# Patient Record
Sex: Female | Born: 1970 | Race: White | Hispanic: No | Marital: Married | Smoking: Never smoker
Health system: Southern US, Community
[De-identification: ages and names within clinical notes are randomized; demographics above are authoritative.]

## PROBLEM LIST (undated history)

## (undated) DIAGNOSIS — C50919 Malignant neoplasm of unspecified site of unspecified female breast: Secondary | ICD-10-CM

## (undated) DIAGNOSIS — T8859XA Other complications of anesthesia, initial encounter: Secondary | ICD-10-CM

## (undated) DIAGNOSIS — IMO0001 Reserved for inherently not codable concepts without codable children: Secondary | ICD-10-CM

## (undated) DIAGNOSIS — R112 Nausea with vomiting, unspecified: Secondary | ICD-10-CM

## (undated) DIAGNOSIS — T4145XA Adverse effect of unspecified anesthetic, initial encounter: Secondary | ICD-10-CM

## (undated) DIAGNOSIS — F419 Anxiety disorder, unspecified: Secondary | ICD-10-CM

## (undated) DIAGNOSIS — K219 Gastro-esophageal reflux disease without esophagitis: Secondary | ICD-10-CM

## (undated) DIAGNOSIS — Z9889 Other specified postprocedural states: Secondary | ICD-10-CM

## (undated) DIAGNOSIS — C801 Malignant (primary) neoplasm, unspecified: Secondary | ICD-10-CM

## (undated) HISTORY — PX: BREAST LUMPECTOMY: SHX2

## (undated) HISTORY — DX: Reserved for inherently not codable concepts without codable children: IMO0001

## (undated) HISTORY — PX: REDUCTION MAMMAPLASTY: SUR839

## (undated) HISTORY — DX: Anxiety disorder, unspecified: F41.9

## (undated) HISTORY — DX: Malignant neoplasm of unspecified site of unspecified female breast: C50.919

---
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
## 1998-12-13 ENCOUNTER — Other Ambulatory Visit: Admission: RE | Admit: 1998-12-13 | Discharge: 1998-12-13 | Payer: Self-pay | Admitting: Obstetrics and Gynecology

## 1999-11-28 ENCOUNTER — Other Ambulatory Visit: Admission: RE | Admit: 1999-11-28 | Discharge: 1999-11-28 | Payer: Self-pay | Admitting: Obstetrics and Gynecology

## 2000-01-07 ENCOUNTER — Encounter (INDEPENDENT_AMBULATORY_CARE_PROVIDER_SITE_OTHER): Payer: Self-pay | Admitting: Specialist

## 2000-01-07 ENCOUNTER — Other Ambulatory Visit: Admission: RE | Admit: 2000-01-07 | Discharge: 2000-01-07 | Payer: Self-pay | Admitting: Obstetrics and Gynecology

## 2000-12-09 ENCOUNTER — Other Ambulatory Visit: Admission: RE | Admit: 2000-12-09 | Discharge: 2000-12-09 | Payer: Self-pay | Admitting: Obstetrics and Gynecology

## 2001-12-13 ENCOUNTER — Other Ambulatory Visit: Admission: RE | Admit: 2001-12-13 | Discharge: 2001-12-13 | Payer: Self-pay | Admitting: Obstetrics and Gynecology

## 2003-01-02 ENCOUNTER — Other Ambulatory Visit: Admission: RE | Admit: 2003-01-02 | Discharge: 2003-01-02 | Payer: Self-pay | Admitting: Obstetrics and Gynecology

## 2004-10-04 ENCOUNTER — Ambulatory Visit (HOSPITAL_COMMUNITY): Admission: RE | Admit: 2004-10-04 | Discharge: 2004-10-04 | Payer: Self-pay | Admitting: Obstetrics and Gynecology

## 2005-04-17 ENCOUNTER — Other Ambulatory Visit: Admission: RE | Admit: 2005-04-17 | Discharge: 2005-04-17 | Payer: Self-pay | Admitting: Gynecology

## 2005-09-17 ENCOUNTER — Ambulatory Visit: Payer: Self-pay | Admitting: Obstetrics and Gynecology

## 2005-09-19 ENCOUNTER — Inpatient Hospital Stay (HOSPITAL_COMMUNITY): Admission: AD | Admit: 2005-09-19 | Discharge: 2005-09-19 | Payer: Self-pay | Admitting: Gynecology

## 2005-09-24 ENCOUNTER — Ambulatory Visit: Payer: Self-pay | Admitting: Obstetrics and Gynecology

## 2005-09-30 ENCOUNTER — Ambulatory Visit: Payer: Self-pay | Admitting: *Deleted

## 2005-10-07 ENCOUNTER — Ambulatory Visit: Payer: Self-pay | Admitting: Obstetrics and Gynecology

## 2005-10-10 ENCOUNTER — Ambulatory Visit: Payer: Self-pay | Admitting: *Deleted

## 2005-10-13 ENCOUNTER — Ambulatory Visit: Payer: Self-pay | Admitting: Obstetrics and Gynecology

## 2005-10-14 ENCOUNTER — Encounter (INDEPENDENT_AMBULATORY_CARE_PROVIDER_SITE_OTHER): Payer: Self-pay | Admitting: Specialist

## 2005-10-14 ENCOUNTER — Inpatient Hospital Stay (HOSPITAL_COMMUNITY): Admission: RE | Admit: 2005-10-14 | Discharge: 2005-10-18 | Payer: Self-pay | Admitting: Gynecology

## 2005-11-25 ENCOUNTER — Other Ambulatory Visit: Admission: RE | Admit: 2005-11-25 | Discharge: 2005-11-25 | Payer: Self-pay | Admitting: Gynecology

## 2006-12-31 ENCOUNTER — Other Ambulatory Visit: Admission: RE | Admit: 2006-12-31 | Discharge: 2006-12-31 | Payer: Self-pay | Admitting: Gynecology

## 2008-01-21 ENCOUNTER — Other Ambulatory Visit: Admission: RE | Admit: 2008-01-21 | Discharge: 2008-01-21 | Payer: Self-pay | Admitting: Gynecology

## 2008-01-27 ENCOUNTER — Ambulatory Visit: Payer: Self-pay | Admitting: Gynecology

## 2009-01-30 ENCOUNTER — Encounter: Payer: Self-pay | Admitting: Gynecology

## 2009-01-30 ENCOUNTER — Other Ambulatory Visit: Admission: RE | Admit: 2009-01-30 | Discharge: 2009-01-30 | Payer: Self-pay | Admitting: Gynecology

## 2009-01-30 ENCOUNTER — Ambulatory Visit: Payer: Self-pay | Admitting: Gynecology

## 2010-02-01 ENCOUNTER — Other Ambulatory Visit: Admission: RE | Admit: 2010-02-01 | Discharge: 2010-02-01 | Payer: Self-pay | Admitting: Gynecology

## 2010-02-01 ENCOUNTER — Ambulatory Visit: Payer: Self-pay | Admitting: Gynecology

## 2010-05-27 ENCOUNTER — Ambulatory Visit: Payer: Self-pay | Admitting: Gynecology

## 2010-07-03 ENCOUNTER — Ambulatory Visit: Payer: Self-pay | Admitting: Gynecology

## 2010-11-01 ENCOUNTER — Ambulatory Visit (INDEPENDENT_AMBULATORY_CARE_PROVIDER_SITE_OTHER): Payer: BC Managed Care – PPO | Admitting: Gynecology

## 2010-11-01 DIAGNOSIS — Z30431 Encounter for routine checking of intrauterine contraceptive device: Secondary | ICD-10-CM

## 2010-11-27 ENCOUNTER — Ambulatory Visit: Payer: BC Managed Care – PPO | Admitting: Gynecology

## 2010-12-12 ENCOUNTER — Ambulatory Visit (INDEPENDENT_AMBULATORY_CARE_PROVIDER_SITE_OTHER): Payer: BC Managed Care – PPO | Admitting: Gynecology

## 2010-12-12 DIAGNOSIS — Z30431 Encounter for routine checking of intrauterine contraceptive device: Secondary | ICD-10-CM

## 2011-01-31 NOTE — Op Note (Signed)
Penny Acosta, Penny Acosta                ACCOUNT NO.:  [id number]   MEDICAL RECORD NO.:  [id number]          PATIENT TYPE:  INP   LOCATION:  9119                          FACILITY:  [facility]   PHYSICIAN:  Timothy P. Fontaine, M.D.DATE OF BIRTH:  October 17, 1970   DATE OF PROCEDURE:  10/14/2005  DATE OF DISCHARGE:                                 OPERATIVE REPORT   PREOPERATIVE DIAGNOSES:  1.  Pregnancy at 37-38 weeks' gestation.  2.  Twin gestation.  3.  Vertex-breech presentation.  4.  Herpes simplex virus II, prodromal symptoms   POSTOPERATIVE DIAGNOSES:  1.  Pregnancy at 37-38 weeks' gestation.  2.  Twin gestation.  3.  Vertex-breech presentation.  4.  Herpes simplex virus II, prodromal symptoms   PROCEDURE:  Primary low transverse cervical cesarean section.   SURGEON:  Timothy P. Fontaine, M.D.   ASSISTANT:  Ivor Costa. Farrel Gobble, M.D.   ANESTHETIC:  Spinal.   ESTIMATED BLOOD LOSS:  Less than 500 mL.   COMPLICATIONS:  None.   SPECIMEN:  1.  Samples of cord blood, twin A, twin B.  2.  Placenta, cord clamp on twin B cord.   FINDINGS:  Twin A female, 0745 Apgars 9/9, weight 6 pounds 13 ounces.  Twin B  female at 42, Apgars 9/9, weight 5 pounds 4 ounces.  Pelvic anatomy noted  to be normal.   PROCEDURE:  The patient was taken to the operating room, underwent spinal  anesthesia, was placed left tilt supine position, received an abdominal  preparation with Betadine solution.  Bladder emptied with an indwelling  Foley catheterization placed in sterile technique per nursing personnel.  The patient was draped in the usual fashion and after assuring adequate  anesthesia, the abdomen was sharply entered through a Pfannenstiel incision  and this incision achieving adequate hemostasis at all levels.  The bladder  flap was sharply and bluntly developed without difficulty.  The lower  uterine segment was then sharply incised and bluntly extended laterally.  The bulging membranes were ruptured, the  fluid noted to be clear, and twin A  was delivered in vertex presentation.  Nares and mouth suctioned, the rest  of the infant delivered, the cord doubly clamped and cut.  The infant was  handed to pediatrics in attendance.  Twin B's membranes were then ruptured,  again the fluid noted to be clear, and the infant was found to be in the  frank breech presentation, converted to a footling breech and underwent a  breech extraction without difficulty.  The nares and mouth were suctioned,  the cord doubly clamped and cut.  The infant was handed to pediatrics in  attendance.  Samples of cord blood from both umbilical cords were taken and  the cord clamp was placed on twin B's cord.  The placenta was then  spontaneously extruded and noted to be intact.  It was sent to pathology.  The uterus was exteriorized, endometrial cavity explored with a sponge to  remove all placental and membrane fragments.  The uterine incision was then  closed in two layers using 0 Vicryl suture, first in  a running interlocking  stitch, followed by an imbricating stitch.  The uterus was returned to the  abdomen, which was copiously irrigated.  Adequate hemostasis was visualized  and the anterior fascia was reapproximated using 0 Vicryl suture in a  running stitch starting at the angle and meeting in the middle.  Subcutaneous tissue was irrigated and adequate hemostasis achieved with  electrocautery.  The skin reapproximated using 4-0 Vicryl in a running  subcuticular stitch.  Steri-Strips and Benzoin applied.  Sterile dressing  applied.  The patient taken to the recovery room in good condition, having  tolerated procedure well.      Timothy P. Fontaine, M.D.  Electronically Signed     TPF/MEDQ  D:  10/14/2005  T:  10/14/2005  Job:  [id number]

## 2011-01-31 NOTE — Discharge Summary (Signed)
NAMEMAKELLE, MARRONE                ACCOUNT NO.:  [id number]   MEDICAL RECORD NO.:  [id number]          PATIENT TYPE:  INP   LOCATION:  9119                          [facility]ACILITY:  [facility]   PHYSICIAN:  Ivor Costa. Farrel Gobble, M.D. DATE OF BIRTH:  [birth year]   DATE OF ADMISSION:  10/14/2005  DATE OF DISCHARGE:  10/18/2005                                 DISCHARGE SUMMARY   PRINCIPAL DIAGNOSIS:  Term pregnancy with twins.   PRINCIPAL PROCEDURE:  Primary cesarean section.   HISTORY OF PRESENT ILLNESS:  Refer to the dictated H&P. The patient  presented on the morning of October 14, 2005 and underwent a prior cesarean  section for twin gestation with viable delivery of baby A, a female with  Apgars of 9/9 with birth weight of 6 pounds 13 ounces. Baby B was a female  with Apgars of 9/9 and a birth weight of 7 pounds, 4 ounces, under spinal  anesthesia with estimated blood loss of less than 500. Her postoperative  course was unremarkable. The patient remained afebrile with vitals stable  throughout.  At the time of discharge, the patient was able tolerate the two  children, she was bottle feeding, her pain was well controlled with oral  analgesics, and she was ready for discharge.  On postoperative exam, her  abdomen was soft, nontender without rebound or guarding. The uterus was firm  below the umbilicus. Her incision was clean and intact. Her extremities were  nontender.   CONDITION ON DISCHARGE:  She was discharged home in stable condition.   DISCHARGE MEDICATIONS:  She was discharged with prescriptions for -  1.  Tylox one to two q.6 h p.r.n. pain, #20.  2.  It has been recommended she take over-the-counter Motrin.   POSTOPERATIVE DIAGNOSES:  Hemoglobin of 8.2, hematocrit was 24.6, her  platelets were 155, and her white count was 9.2.      Ivor Costa. Farrel Gobble, M.D.  Electronically Signed     THL/MEDQ  D:  11/05/2005  T:  11/05/2005  Job:  [id number]

## 2011-01-31 NOTE — H&P (Signed)
Penny Acosta, Penny Acosta                ACCOUNT NO.:  [id number]   MEDICAL RECORD NO.:  [id number]          PATIENT TYPE:  INP   LOCATION:  NA                            [facility]:  [facility]   PHYSICIAN:  Timothy P. Fontaine, M.D.DATE OF BIRTH:  Apr 12, 1971   DATE OF ADMISSION:  10/14/2005  DATE OF DISCHARGE:                                HISTORY & PHYSICAL   CHIEF COMPLAINT:  1.  Pregnancy at 37-38 weeks' gestation.  2.  Twin gestation.  3.  History of herpes simplex virus II with prodromal symptoms.   HISTORY OF PRESENT ILLNESS:  A 40 year old G2, P0, female at 45-38 weeks'  gestation with a history of twin gestation.  The fetuses have been variable  presentations over the last several weeks from vertex-breech to vertex-  vertex.  The patient initially had planned for attempt at vaginal delivery  if vertex-vertex but has been having feelings of prodromal HSV and elects  for a primary cesarean section.  For the remainder of her history, see her  [place].   PHYSICAL EXAMINATION:  HEENT: Normal.  LUNGS:  Clear.  CARDIAC:  Regular rate without rubs, murmurs or gallops.  ABDOMEN:  Gravid uterus consistent with twin gestation, reactive fetal  tracing x2.  PELVIC:  Deferred.   ASSESSMENT:  A 40 year old G2, P0, female with twin gestation at 68-38  weeks, last check vertex-vertex presentation, with prodromal herpes simplex  virus symptoms, for primary cesarean section.  The risks, benefits,  indications and alternatives for the procedure were reviewed to include the  expected intraoperative, postoperative courses.  The risks of infection,  prolonged antibiotics, wound complications requiring opening and draining of  incisions, closure by secondary intention, was discussed, understood and  accepted.  The risks of hemorrhage necessitating transfusion and risks of  transfusion were reviewed.  The risks of inadvertent injury to internal  organs including bowel, bladder, ureters, vessels and  nerves necessitating  major exploratory or reparative surgeries and future reparative surgeries  including ostomy formation was all discussed, understood, accepted.  The  risks of fetal injury during the birthing process to include  musculoskeletal, neural as well as scalpel injuries were all reviewed.  The  patient is beta strep-negative.  The patient's questions were answered to  her satisfaction and she is ready to proceed with surgery.      Timothy P. Fontaine, M.D.  Electronically Signed     TPF/MEDQ  D:  10/10/2005  T:  10/10/2005  Job:  [id number]

## 2011-05-28 ENCOUNTER — Telehealth: Payer: Self-pay | Admitting: *Deleted

## 2011-05-28 NOTE — Telephone Encounter (Signed)
Pt called wanting records faxed to [facility] clinic. Lm on pt vm that she will need to fill out medical release form so we can fax recent lab results to [facility].

## 2012-01-02 ENCOUNTER — Encounter: Payer: Self-pay | Admitting: Gynecology

## 2012-01-13 ENCOUNTER — Ambulatory Visit (INDEPENDENT_AMBULATORY_CARE_PROVIDER_SITE_OTHER): Payer: BC Managed Care – PPO | Admitting: Gynecology

## 2012-01-13 ENCOUNTER — Encounter: Payer: Self-pay | Admitting: Gynecology

## 2012-01-13 ENCOUNTER — Other Ambulatory Visit (HOSPITAL_COMMUNITY)
Admission: RE | Admit: 2012-01-13 | Discharge: 2012-01-13 | Disposition: A | Discharge status: Home / Self Care | Payer: BC Managed Care – PPO | Source: Ambulatory Visit | Attending: Gynecology | Admitting: Gynecology

## 2012-01-13 VITALS — BP 108/60 | Ht 62.75 in | Wt 200.0 lb

## 2012-01-13 DIAGNOSIS — R5383 Other fatigue: Secondary | ICD-10-CM

## 2012-01-13 DIAGNOSIS — Z13220 Encounter for screening for lipoid disorders: Secondary | ICD-10-CM

## 2012-01-13 DIAGNOSIS — Z30431 Encounter for routine checking of intrauterine contraceptive device: Secondary | ICD-10-CM

## 2012-01-13 DIAGNOSIS — R109 Unspecified abdominal pain: Secondary | ICD-10-CM

## 2012-01-13 DIAGNOSIS — Z01419 Encounter for gynecological examination (general) (routine) without abnormal findings: Secondary | ICD-10-CM | POA: Insufficient documentation

## 2012-01-13 DIAGNOSIS — R5381 Other malaise: Secondary | ICD-10-CM

## 2012-01-13 DIAGNOSIS — Z131 Encounter for screening for diabetes mellitus: Secondary | ICD-10-CM

## 2012-01-13 LAB — COMPREHENSIVE METABOLIC PANEL
ALT: 19 U/L (ref 0–35)
AST: 22 U/L (ref 0–37)
Alkaline Phosphatase: 51 U/L (ref 39–117)
BUN: 14 mg/dL (ref 6–23)
Calcium: 9.4 mg/dL (ref 8.4–10.5)
Chloride: 103 mEq/L (ref 96–112)
Creat: 0.79 mg/dL (ref 0.50–1.10)
Potassium: 4.1 mEq/L (ref 3.5–5.3)

## 2012-01-13 LAB — CBC WITH DIFFERENTIAL/PLATELET
Basophils Absolute: 0.1 10*3/uL (ref 0.0–0.1)
Eosinophils Absolute: 0.2 10*3/uL (ref 0.0–0.7)
Eosinophils Relative: 3 % (ref 0–5)
HCT: 40.7 % (ref 36.0–46.0)
MCH: 29.7 pg (ref 26.0–34.0)
MCV: 88.9 fL (ref 78.0–100.0)
Monocytes Absolute: 0.7 10*3/uL (ref 0.1–1.0)
Platelets: 278 10*3/uL (ref 150–400)
RDW: 12.8 % (ref 11.5–15.5)

## 2012-01-13 LAB — URINALYSIS W MICROSCOPIC + REFLEX CULTURE
Casts: NONE SEEN
Crystals: NONE SEEN
Glucose, UA: NEGATIVE mg/dL
Leukocytes, UA: NEGATIVE
Nitrite: NEGATIVE
Specific Gravity, Urine: 1.01 (ref 1.005–1.030)
WBC, UA: NONE SEEN WBC/hpf (ref <3)
pH: 7 (ref 5.0–8.0)

## 2012-01-13 LAB — FOLLICLE STIMULATING HORMONE: FSH: 5 m[IU]/mL

## 2012-01-13 LAB — LIPID PANEL
HDL: 39 mg/dL — ABNORMAL LOW (ref >39)
LDL Cholesterol: 117 mg/dL — ABNORMAL HIGH (ref 0–99)
Triglycerides: 145 mg/dL (ref <150)
VLDL: 29 mg/dL (ref 0–40)

## 2012-01-13 NOTE — Progress Notes (Signed)
LYNDSEY DEMOS 05/29/1971 [id number]        41 y.o.  for annual exam.  Several issues below.  Past medical history,surgical history, medications, allergies, family history and social history were all reviewed and documented in the EPIC chart. ROS:  Was performed and pertinent positives and negatives are included in the history.  Exam: Sherrilyn Rist chaperone present Filed Vitals:   01/13/12 1025  BP: 108/60   General appearance  Normal Skin grossly normal Head/Neck normal with no cervical or supraclavicular adenopathy thyroid normal Lungs  clear Cardiac RR, without RMG Abdominal  soft, nontender, without masses, organomegaly or hernia Breasts  examined lying and sitting without masses, retractions, discharge or axillary adenopathy.  Bilateral reduction scars noted Pelvic  Ext/BUS/vagina  normal   Cervix  normal  Pap done, IUD string visualized  Uterus  introverted, normal size, shape and contour, midline and mobile nontender   Adnexa  Without masses or tenderness    Anus and perineum  normal   Rectovaginal  normal sphincter tone without palpated masses or tenderness.    Assessment/Plan:  41 y.o. female for annual exam.    1. Pelvic/abdominal cramping. Patient knows of the last several months she's had some pressure and cramping symptoms that come and go. She is amenorrheic since her IUD placement. No spotting or other bleeding. No nausea vomiting diarrhea constipation or urinary symptoms. Urinalysis shows rare bacteria. Otherwise negative. We'll check culture.  We'll start with ultrasound rule out nonpalpable abnormalities.  Mild SUI symptoms. We'll readdress after above workup. 2. Fatigue/difficulty losing weight despite diet. We'll check baseline TSH, FSH, CBC, comprehensive metabolic panel. 3. IUD. Mirena IUD placed February 2012. IUD string visualized. We'll follow up for ultrasound as noted above. 4. Pap smear. She has not had a Pap smear in 2 years I did one today. 5. Mammography. She's  overdue for her mammogram and I gave her a written request slip and she's can arrange this in [place].  SBE monthly reviewed. 6. Health maintenance. Lipid profile ordered along with the above lab work.  Patient will follow up for her ultrasound and lab work.    Dara Lords MD, 10:56 AM 01/13/2012

## 2012-01-13 NOTE — Patient Instructions (Signed)
Follow up for lab results and ultrasound as scheduled. 

## 2012-01-14 ENCOUNTER — Other Ambulatory Visit: Payer: Self-pay | Admitting: *Deleted

## 2012-01-14 DIAGNOSIS — E7800 Pure hypercholesterolemia, unspecified: Secondary | ICD-10-CM

## 2012-01-16 ENCOUNTER — Ambulatory Visit (INDEPENDENT_AMBULATORY_CARE_PROVIDER_SITE_OTHER): Payer: BC Managed Care – PPO | Admitting: Gynecology

## 2012-01-16 ENCOUNTER — Encounter: Payer: Self-pay | Admitting: Gynecology

## 2012-01-16 ENCOUNTER — Ambulatory Visit (INDEPENDENT_AMBULATORY_CARE_PROVIDER_SITE_OTHER): Payer: BC Managed Care – PPO

## 2012-01-16 DIAGNOSIS — Z30431 Encounter for routine checking of intrauterine contraceptive device: Secondary | ICD-10-CM

## 2012-01-16 DIAGNOSIS — R109 Unspecified abdominal pain: Secondary | ICD-10-CM

## 2012-01-16 DIAGNOSIS — N949 Unspecified condition associated with female genital organs and menstrual cycle: Secondary | ICD-10-CM

## 2012-01-16 DIAGNOSIS — R102 Pelvic and perineal pain: Secondary | ICD-10-CM

## 2012-01-16 NOTE — Progress Notes (Signed)
Patient presents for ultrasound due to her history of some pelvic cramping with her IUD. No bleeding or other complaints. Ultrasound today shows endometrial echo 5.5 mm with IUD in the endometrial cavity. No uterine abnormalities seen. Left ovary normal, right ovary with echo-free thin-walled avascular cyst at 31 x 23 mm. No free fluid.  Discussed this with the patient. I think the right ovarian changes physiologic. She will keep a pain calendar and we'll see how she does over the next several months.  Assuming the pain resolves, we'll follow, if it continues or worsens she'll represent for further evaluation.  I reviewed her lab work with her which showed a normal TSH FSH and normal continence metabolic panel. Her glucose was 100 and her lipid profile showed a normal cholesterol with a mildly elevated LDL of 117. Increased exercise, decrease fat in her diet with some weight loss was recommended and we'll recheck a fasting lipid profile in a year.

## 2012-01-16 NOTE — Patient Instructions (Signed)
Monitor pelvic cramping. Assuming it resolves and we'll follow expectantly. If it worsens or new symptoms develop represent for further evaluation.

## 2012-01-22 ENCOUNTER — Other Ambulatory Visit: Payer: BC Managed Care – PPO

## 2012-01-22 ENCOUNTER — Ambulatory Visit: Payer: BC Managed Care – PPO | Admitting: Gynecology

## 2012-01-28 ENCOUNTER — Ambulatory Visit: Payer: Self-pay | Admitting: Gynecology

## 2012-01-30 ENCOUNTER — Encounter: Payer: Self-pay | Admitting: Gynecology

## 2013-03-21 ENCOUNTER — Ambulatory Visit (INDEPENDENT_AMBULATORY_CARE_PROVIDER_SITE_OTHER): Payer: BC Managed Care – PPO | Admitting: Gynecology

## 2013-03-21 ENCOUNTER — Encounter: Payer: Self-pay | Admitting: Gynecology

## 2013-03-21 VITALS — BP 102/68 | Ht 63.25 in | Wt 207.0 lb

## 2013-03-21 DIAGNOSIS — Z13220 Encounter for screening for lipoid disorders: Secondary | ICD-10-CM

## 2013-03-21 DIAGNOSIS — Z01419 Encounter for gynecological examination (general) (routine) without abnormal findings: Secondary | ICD-10-CM

## 2013-03-21 DIAGNOSIS — Z30431 Encounter for routine checking of intrauterine contraceptive device: Secondary | ICD-10-CM

## 2013-03-21 LAB — CBC WITH DIFFERENTIAL/PLATELET
Eosinophils Absolute: 0.1 10*3/uL (ref 0.0–0.7)
Eosinophils Relative: 2 % (ref 0–5)
HCT: 38.1 % (ref 36.0–46.0)
Lymphocytes Relative: 23 % (ref 12–46)
Lymphs Abs: 1.3 10*3/uL (ref 0.7–4.0)
MCH: 28.9 pg (ref 26.0–34.0)
MCV: 84.7 fL (ref 78.0–100.0)
Monocytes Absolute: 0.7 10*3/uL (ref 0.1–1.0)
Monocytes Relative: 12 % (ref 3–12)
RBC: 4.5 MIL/uL (ref 3.87–5.11)
WBC: 5.7 10*3/uL (ref 4.0–10.5)

## 2013-03-21 NOTE — Patient Instructions (Signed)
Followup with [facility] [phone] in reference to your hemorrhoids. Followup with me in one year for annual exam.

## 2013-03-21 NOTE — Progress Notes (Signed)
Penny Acosta 09-20-1970 [id number]        42 y.o.  G2P0002 for annual exam.  Doing well without complaints.  Past medical history,surgical history, medications, allergies, family history and social history were all reviewed and documented in the EPIC chart.  ROS:  Performed and pertinent positives and negatives are included in the history, assessment and plan .  Exam: Biomedical scientist Filed Vitals:   03/21/13 1447  BP: 102/68  Height: 5' 3.25" (1.607 m)  Weight: 207 lb (93.895 kg)   General appearance  Normal Skin grossly normal Head/Neck normal with no cervical or supraclavicular adenopathy thyroid normal Lungs  clear Cardiac RR, without RMG Abdominal  soft, nontender, without masses, organomegaly or hernia Breasts  examined lying and sitting without masses, retractions, discharge or axillary adenopathy. Bilateral well-healed reduction scars Pelvic  Ext/BUS/vagina  normal   Cervix  normal. IUD string visualized   Uterus  anteverted, normal size, shape and contour, midline and mobile nontender   Adnexa  Without masses or tenderness    Anus and perineum  normal   Rectovaginal  normal sphincter tone without palpated masses or tenderness. Old external hemorrhoid noted   Assessment/Plan:  42 y.o. G2P0002 female for annual exam.   1. Mirena IUD 10/2010. Doing well, amenorrheic. IUD string visualized. Continue to monitor. 2. Mammography 01/2012. Slip to schedule this year given and agrees to do so. SBE monthly reviewed. 3. Pap smear 2013. No Pap smear done today. No history of abnormal Pap smears previously. Plan repeat at 3 year interval. 4. External hemorrhoids. Patient desires banding and I gave her a referral to the surgeons for this. 5. Health maintenance. Baseline CBC lipid profile comprehensive metabolic panel urinalysis ordered. Did have elevated LDL at 117 and glucose at 696 last year. Was to followup but never did. Followup one year, sooner as needed.  Note: This document  was prepared with digital dictation and possible smart phrase technology. Any transcriptional errors that result from this process are unintentional.   Dara Lords MD, 3:11 PM 03/21/2013

## 2013-03-22 LAB — URINALYSIS W MICROSCOPIC + REFLEX CULTURE
Bacteria, UA: NONE SEEN
Bilirubin Urine: NEGATIVE
Crystals: NONE SEEN
Glucose, UA: NEGATIVE mg/dL
Ketones, ur: NEGATIVE mg/dL
Protein, ur: NEGATIVE mg/dL
Squamous Epithelial / LPF: NONE SEEN

## 2013-03-22 LAB — LIPID PANEL
Cholesterol: 164 mg/dL (ref 0–200)
Total CHOL/HDL Ratio: 3.9 Ratio
Triglycerides: 129 mg/dL (ref <150)

## 2013-03-22 LAB — COMPREHENSIVE METABOLIC PANEL
ALT: 18 U/L (ref 0–35)
BUN: 15 mg/dL (ref 6–23)
CO2: 24 mEq/L (ref 19–32)
Calcium: 9 mg/dL (ref 8.4–10.5)
Chloride: 104 mEq/L (ref 96–112)
Creat: 0.86 mg/dL (ref 0.50–1.10)
Glucose, Bld: 90 mg/dL (ref 70–99)
Total Bilirubin: 0.3 mg/dL (ref 0.3–1.2)

## 2013-04-05 ENCOUNTER — Ambulatory Visit: Payer: Self-pay | Admitting: Gynecology

## 2013-04-06 ENCOUNTER — Encounter: Payer: Self-pay | Admitting: Gynecology

## 2013-04-13 ENCOUNTER — Ambulatory Visit (INDEPENDENT_AMBULATORY_CARE_PROVIDER_SITE_OTHER): Payer: Self-pay | Admitting: General Surgery

## 2013-04-18 ENCOUNTER — Encounter: Payer: Self-pay | Admitting: Gynecology

## 2013-04-29 ENCOUNTER — Ambulatory Visit (INDEPENDENT_AMBULATORY_CARE_PROVIDER_SITE_OTHER): Payer: Self-pay | Admitting: General Surgery

## 2013-05-26 ENCOUNTER — Encounter: Payer: Self-pay | Admitting: Gynecology

## 2013-06-06 ENCOUNTER — Ambulatory Visit (INDEPENDENT_AMBULATORY_CARE_PROVIDER_SITE_OTHER): Payer: Self-pay | Admitting: General Surgery

## 2013-06-22 ENCOUNTER — Ambulatory Visit (INDEPENDENT_AMBULATORY_CARE_PROVIDER_SITE_OTHER): Payer: Self-pay | Admitting: General Surgery

## 2013-07-21 ENCOUNTER — Other Ambulatory Visit: Payer: Self-pay

## 2014-04-20 ENCOUNTER — Ambulatory Visit: Payer: Self-pay | Admitting: Gynecology

## 2014-04-21 ENCOUNTER — Encounter: Payer: Self-pay | Admitting: Gynecology

## 2014-05-05 ENCOUNTER — Ambulatory Visit (INDEPENDENT_AMBULATORY_CARE_PROVIDER_SITE_OTHER): Payer: BC Managed Care – PPO | Admitting: Gynecology

## 2014-05-05 ENCOUNTER — Encounter: Payer: Self-pay | Admitting: Gynecology

## 2014-05-05 VITALS — BP 120/76 | Ht 63.0 in | Wt 200.0 lb

## 2014-05-05 DIAGNOSIS — Z01419 Encounter for gynecological examination (general) (routine) without abnormal findings: Secondary | ICD-10-CM

## 2014-05-05 DIAGNOSIS — Z30431 Encounter for routine checking of intrauterine contraceptive device: Secondary | ICD-10-CM

## 2014-05-05 LAB — CBC WITH DIFFERENTIAL/PLATELET
BASOS ABS: 0.1 10*3/uL (ref 0.0–0.1)
Basophils Relative: 1 % (ref 0–1)
EOS PCT: 2 % (ref 0–5)
Eosinophils Absolute: 0.1 10*3/uL (ref 0.0–0.7)
HEMATOCRIT: 37.8 % (ref 36.0–46.0)
HEMOGLOBIN: 12.8 g/dL (ref 12.0–15.0)
LYMPHS ABS: 2.3 10*3/uL (ref 0.7–4.0)
LYMPHS PCT: 34 % (ref 12–46)
MCH: 29.7 pg (ref 26.0–34.0)
MCHC: 33.9 g/dL (ref 30.0–36.0)
MCV: 87.7 fL (ref 78.0–100.0)
MONO ABS: 0.8 10*3/uL (ref 0.1–1.0)
MONOS PCT: 11 % (ref 3–12)
Neutro Abs: 3.6 10*3/uL (ref 1.7–7.7)
Neutrophils Relative %: 52 % (ref 43–77)
Platelets: 287 10*3/uL (ref 150–400)
RBC: 4.31 MIL/uL (ref 3.87–5.11)
RDW: 13.1 % (ref 11.5–15.5)
WBC: 6.9 10*3/uL (ref 4.0–10.5)

## 2014-05-05 NOTE — Patient Instructions (Signed)
You may obtain a copy of any labs that were done today by logging onto MyChart as outlined in the instructions provided with your AVS (after visit summary). The office will not call with normal lab results but certainly if there are any significant abnormalities then we will contact you.   Health Maintenance, Female A healthy lifestyle and preventative care can promote health and wellness.  Maintain regular health, dental, and eye exams.  Eat a healthy diet. Foods like vegetables, fruits, whole grains, low-fat dairy products, and lean protein foods contain the nutrients you need without too many calories. Decrease your intake of foods high in solid fats, added sugars, and salt. Get information about a proper diet from your caregiver, if necessary.  Regular physical exercise is one of the most important things you can do for your health. Most adults should get at least 150 minutes of moderate-intensity exercise (any activity that increases your heart rate and causes you to sweat) each week. In addition, most adults need muscle-strengthening exercises on 2 or more days a week.   Maintain a healthy weight. The body mass index (BMI) is a screening tool to identify possible weight problems. It provides an estimate of body fat based on height and weight. Your caregiver can help determine your BMI, and can help you achieve or maintain a healthy weight. For adults 20 years and older:  A BMI below 18.5 is considered underweight.  A BMI of 18.5 to 24.9 is normal.  A BMI of 25 to 29.9 is considered overweight.  A BMI of 30 and above is considered obese.  Maintain normal blood lipids and cholesterol by exercising and minimizing your intake of saturated fat. Eat a balanced diet with plenty of fruits and vegetables. Blood tests for lipids and cholesterol should begin at age 61 and be repeated every 5 years. If your lipid or cholesterol levels are high, you are over 50, or you are a high risk for heart  disease, you may need your cholesterol levels checked more frequently.Ongoing high lipid and cholesterol levels should be treated with medicines if diet and exercise are not effective.  If you smoke, find out from your caregiver how to quit. If you do not use tobacco, do not start.  Lung cancer screening is recommended for adults aged 33 80 years who are at high risk for developing lung cancer because of a history of smoking. Yearly low-dose computed tomography (CT) is recommended for people who have at least a 30-pack-year history of smoking and are a current smoker or have quit within the past 15 years. A pack year of smoking is smoking an average of 1 pack of cigarettes a day for 1 year (for example: 1 pack a day for 30 years or 2 packs a day for 15 years). Yearly screening should continue until the smoker has stopped smoking for at least 15 years. Yearly screening should also be stopped for people who develop a health problem that would prevent them from having lung cancer treatment.  If you are pregnant, do not drink alcohol. If you are breastfeeding, be very cautious about drinking alcohol. If you are not pregnant and choose to drink alcohol, do not exceed 1 drink per day. One drink is considered to be 12 ounces (355 mL) of beer, 5 ounces (148 mL) of wine, or 1.5 ounces (44 mL) of liquor.  Avoid use of street drugs. Do not share needles with anyone. Ask for help if you need support or instructions about stopping  the use of drugs.  High blood pressure causes heart disease and increases the risk of stroke. Blood pressure should be checked at least every 1 to 2 years. Ongoing high blood pressure should be treated with medicines, if weight loss and exercise are not effective.  If you are 59 to 45 years old, ask your caregiver if you should take aspirin to prevent strokes.  Diabetes screening involves taking a blood sample to check your fasting blood sugar level. This should be done once every 3  years, after age 91, if you are within normal weight and without risk factors for diabetes. Testing should be considered at a younger age or be carried out more frequently if you are overweight and have at least 1 risk factor for diabetes.  Breast cancer screening is essential preventative care for women. You should practice "breast self-awareness." This means understanding the normal appearance and feel of your breasts and may include breast self-examination. Any changes detected, no matter how small, should be reported to a caregiver. Women in their 66s and 30s should have a clinical breast exam (CBE) by a caregiver as part of a regular health exam every 1 to 3 years. After age 101, women should have a CBE every year. Starting at age 100, women should consider having a mammogram (breast X-ray) every year. Women who have a family history of breast cancer should talk to their caregiver about genetic screening. Women at a high risk of breast cancer should talk to their caregiver about having an MRI and a mammogram every year.  Breast cancer gene (BRCA)-related cancer risk assessment is recommended for women who have family members with BRCA-related cancers. BRCA-related cancers include breast, ovarian, tubal, and peritoneal cancers. Having family members with these cancers may be associated with an increased risk for harmful changes (mutations) in the breast cancer genes BRCA1 and BRCA2. Results of the assessment will determine the need for genetic counseling and BRCA1 and BRCA2 testing.  The Pap test is a screening test for cervical cancer. Women should have a Pap test starting at age 57. Between ages 25 and 35, Pap tests should be repeated every 2 years. Beginning at age 37, you should have a Pap test every 3 years as long as the past 3 Pap tests have been normal. If you had a hysterectomy for a problem that was not cancer or a condition that could lead to cancer, then you no longer need Pap tests. If you are  between ages 50 and 76, and you have had normal Pap tests going back 10 years, you no longer need Pap tests. If you have had past treatment for cervical cancer or a condition that could lead to cancer, you need Pap tests and screening for cancer for at least 20 years after your treatment. If Pap tests have been discontinued, risk factors (such as a new sexual partner) need to be reassessed to determine if screening should be resumed. Some women have medical problems that increase the chance of getting cervical cancer. In these cases, your caregiver may recommend more frequent screening and Pap tests.  The human papillomavirus (HPV) test is an additional test that may be used for cervical cancer screening. The HPV test looks for the virus that can cause the cell changes on the cervix. The cells collected during the Pap test can be tested for HPV. The HPV test could be used to screen women aged 44 years and older, and should be used in women of any age  who have unclear Pap test results. After the age of 55, women should have HPV testing at the same frequency as a Pap test.  Colorectal cancer can be detected and often prevented. Most routine colorectal cancer screening begins at the age of 44 and continues through age 20. However, your caregiver may recommend screening at an earlier age if you have risk factors for colon cancer. On a yearly basis, your caregiver may provide home test kits to check for hidden blood in the stool. Use of a small camera at the end of a tube, to directly examine the colon (sigmoidoscopy or colonoscopy), can detect the earliest forms of colorectal cancer. Talk to your caregiver about this at age 86, when routine screening begins. Direct examination of the colon should be repeated every 5 to 10 years through age 13, unless early forms of pre-cancerous polyps or small growths are found.  Hepatitis C blood testing is recommended for all people born from [birth year] through 1967 and any  individual with known risks for hepatitis C.  Practice safe sex. Use condoms and avoid high-risk sexual practices to reduce the spread of sexually transmitted infections (STIs). Sexually active women aged 36 and younger should be checked for Chlamydia, which is a common sexually transmitted infection. Older women with new or multiple partners should also be tested for Chlamydia. Testing for other STIs is recommended if you are sexually active and at increased risk.  Osteoporosis is a disease in which the bones lose minerals and strength with aging. This can result in serious bone fractures. The risk of osteoporosis can be identified using a bone density scan. Women ages 20 and over and women at risk for fractures or osteoporosis should discuss screening with their caregivers. Ask your caregiver whether you should be taking a calcium supplement or vitamin D to reduce the rate of osteoporosis.  Menopause can be associated with physical symptoms and risks. Hormone replacement therapy is available to decrease symptoms and risks. You should talk to your caregiver about whether hormone replacement therapy is right for you.  Use sunscreen. Apply sunscreen liberally and repeatedly throughout the day. You should seek shade when your shadow is shorter than you. Protect yourself by wearing long sleeves, pants, a wide-brimmed hat, and sunglasses year round, whenever you are outdoors.  Notify your caregiver of new moles or changes in moles, especially if there is a change in shape or color. Also notify your caregiver if a mole is larger than the size of a pencil eraser.  Stay current with your immunizations. Document Released: 03/17/2011 Document Revised: 12/27/2012 Document Reviewed: 03/17/2011 [facility] Patient Information 2014 [employer].

## 2014-05-05 NOTE — Progress Notes (Signed)
Penny Acosta 01/19/1971 [id number]        43 y.o.  G2P0002 for annual exam.  Several issues noted below.  Past medical history,surgical history, problem list, medications, allergies, family history and social history were all reviewed and documented as reviewed in the EPIC chart.  ROS:  12 system ROS performed with pertinent positives and negatives included in the history, assessment and plan.   Additional significant findings :  None   Exam: Kim Counsellor Vitals:   05/05/14 1554  BP: 120/76  Height: 5\' 3"  (1.6 m)  Weight: 200 lb (90.719 kg)   General appearance:  Normal affect, orientation and appearance. Skin: Grossly normal HEENT: Without gross lesions.  No cervical or supraclavicular adenopathy. Thyroid normal.  Lungs:  Clear without wheezing, rales or rhonchi Cardiac: RR, without RMG Abdominal:  Soft, nontender, without masses, guarding, rebound, organomegaly or hernia Breasts:  Examined lying and sitting without masses, retractions, discharge or axillary adenopathy. Bilateral reduction scars noted Pelvic:  Ext/BUS/vagina normal  Cervix normal with IUD string visualized  Uterus anteverted, normal size, shape and contour, midline and mobile nontender   Adnexa  Without masses or tenderness    Anus and perineum  Normal with old external hemorrhoid  Rectovaginal  Normal sphincter tone without palpated masses or tenderness.    Assessment/Plan:  43 y.o. G90P0002 female for annual exam with scant menses, Mirena IUD.   1. Mirena IUD 10/2010. Doing well with scant menses. IUD string visualized. Continue to monitor. 2. Mammography 04/2014. Continued annual mammography. SBE monthly review. 3. Pap smear 12/2011. No Pap smear done today. No history of significant abnormal Pap smears. Plan repeat Pap smear next year 3 year interval. 4. Health maintenance. Baseline CBC comprehensive metabolic panel lipid profile urinalysis ordered. Followup in one year, sooner as needed.   Note:  This document was prepared with digital dictation and possible smart phrase technology. Any transcriptional errors that result from this process are unintentional.   Anastasio Auerbach MD, 4:25 PM 05/05/2014

## 2014-05-06 LAB — LIPID PANEL
CHOL/HDL RATIO: 3.9 ratio
CHOLESTEROL: 173 mg/dL (ref 0–200)
HDL: 44 mg/dL (ref >39)
LDL Cholesterol: 97 mg/dL (ref 0–99)
TRIGLYCERIDES: 160 mg/dL — AB (ref <150)
VLDL: 32 mg/dL (ref 0–40)

## 2014-05-06 LAB — COMPREHENSIVE METABOLIC PANEL
ALBUMIN: 4.2 g/dL (ref 3.5–5.2)
ALT: 17 U/L (ref 0–35)
AST: 21 U/L (ref 0–37)
Alkaline Phosphatase: 50 U/L (ref 39–117)
BUN: 16 mg/dL (ref 6–23)
CALCIUM: 8.8 mg/dL (ref 8.4–10.5)
CHLORIDE: 104 meq/L (ref 96–112)
CO2: 26 meq/L (ref 19–32)
CREATININE: 0.79 mg/dL (ref 0.50–1.10)
GLUCOSE: 90 mg/dL (ref 70–99)
POTASSIUM: 3.9 meq/L (ref 3.5–5.3)
Sodium: 139 mEq/L (ref 135–145)
Total Bilirubin: 0.4 mg/dL (ref 0.2–1.2)
Total Protein: 7 g/dL (ref 6.0–8.3)

## 2014-07-03 ENCOUNTER — Encounter: Payer: Self-pay | Admitting: Gynecology

## 2014-07-17 ENCOUNTER — Encounter: Payer: Self-pay | Admitting: Gynecology

## 2016-02-14 DIAGNOSIS — IMO0001 Reserved for inherently not codable concepts without codable children: Secondary | ICD-10-CM

## 2016-02-14 HISTORY — PX: INTRAUTERINE DEVICE INSERTION: SHX323

## 2016-02-14 HISTORY — DX: Reserved for inherently not codable concepts without codable children: IMO0001

## 2016-02-18 ENCOUNTER — Encounter: Payer: Self-pay | Admitting: Gynecology

## 2016-02-18 ENCOUNTER — Ambulatory Visit (INDEPENDENT_AMBULATORY_CARE_PROVIDER_SITE_OTHER): Payer: BC Managed Care – PPO | Admitting: Gynecology

## 2016-02-18 VITALS — BP 122/76 | Ht 63.0 in | Wt 185.0 lb

## 2016-02-18 DIAGNOSIS — Z30431 Encounter for routine checking of intrauterine contraceptive device: Secondary | ICD-10-CM | POA: Diagnosis not present

## 2016-02-18 DIAGNOSIS — Z13220 Encounter for screening for lipoid disorders: Secondary | ICD-10-CM | POA: Diagnosis not present

## 2016-02-18 DIAGNOSIS — Z01419 Encounter for gynecological examination (general) (routine) without abnormal findings: Secondary | ICD-10-CM | POA: Diagnosis not present

## 2016-02-18 LAB — CBC WITH DIFFERENTIAL/PLATELET
BASOS PCT: 1 %
Basophils Absolute: 61 cells/uL (ref 0–200)
EOS ABS: 488 {cells}/uL (ref 15–500)
EOS PCT: 8 %
HCT: 37.6 % (ref 35.0–45.0)
HEMOGLOBIN: 12.2 g/dL (ref 11.7–15.5)
LYMPHS ABS: 1769 {cells}/uL (ref 850–3900)
LYMPHS PCT: 29 %
MCH: 29 pg (ref 27.0–33.0)
MCHC: 32.4 g/dL (ref 32.0–36.0)
MCV: 89.3 fL (ref 80.0–100.0)
MONO ABS: 488 {cells}/uL (ref 200–950)
MPV: 9.3 fL (ref 7.5–12.5)
Monocytes Relative: 8 %
NEUTROS PCT: 54 %
Neutro Abs: 3294 cells/uL (ref 1500–7800)
PLATELETS: 282 10*3/uL (ref 140–400)
RBC: 4.21 MIL/uL (ref 3.80–5.10)
RDW: 13.8 % (ref 11.0–15.0)
WBC: 6.1 10*3/uL (ref 3.8–10.8)

## 2016-02-18 LAB — URINALYSIS W MICROSCOPIC + REFLEX CULTURE
BACTERIA UA: NONE SEEN [HPF]
Bilirubin Urine: NEGATIVE
Casts: NONE SEEN [LPF]
GLUCOSE, UA: NEGATIVE
KETONES UR: NEGATIVE
LEUKOCYTES UA: NEGATIVE
NITRITE: NEGATIVE
PH: 5.5 (ref 5.0–8.0)
Protein, ur: NEGATIVE
RBC / HPF: NONE SEEN RBC/HPF (ref <=2)
SPECIFIC GRAVITY, URINE: 1.025 (ref 1.001–1.035)
YEAST: NONE SEEN [HPF]

## 2016-02-18 LAB — LIPID PANEL
CHOLESTEROL: 161 mg/dL (ref 125–200)
HDL: 55 mg/dL (ref >=46)
LDL Cholesterol: 88 mg/dL (ref <130)
TRIGLYCERIDES: 90 mg/dL (ref <150)
Total CHOL/HDL Ratio: 2.9 Ratio (ref <=5.0)
VLDL: 18 mg/dL (ref <30)

## 2016-02-18 LAB — COMPREHENSIVE METABOLIC PANEL
ALBUMIN: 3.6 g/dL (ref 3.6–5.1)
ALK PHOS: 47 U/L (ref 33–115)
ALT: 11 U/L (ref 6–29)
AST: 16 U/L (ref 10–35)
BILIRUBIN TOTAL: 0.4 mg/dL (ref 0.2–1.2)
BUN: 9 mg/dL (ref 7–25)
CO2: 24 mmol/L (ref 20–31)
CREATININE: 0.74 mg/dL (ref 0.50–1.10)
Calcium: 8.3 mg/dL — ABNORMAL LOW (ref 8.6–10.2)
Chloride: 107 mmol/L (ref 98–110)
Glucose, Bld: 108 mg/dL — ABNORMAL HIGH (ref 65–99)
Potassium: 3.8 mmol/L (ref 3.5–5.3)
SODIUM: 140 mmol/L (ref 135–146)
TOTAL PROTEIN: 6 g/dL — AB (ref 6.1–8.1)

## 2016-02-18 NOTE — Patient Instructions (Signed)
Follow up to have your IUD replaced  Call to Schedule your mammogram  Facilities in [place]: 1)  [facility]. [address] [place] Phone: [phone] 2)  Dr. Isaiah Blakes at [facility] [address] Phone: [phone]     Mammogram A mammogram is an X-ray test to find changes in a woman's breast. You should get a mammogram if:  You are 47 years of age or older  You have risk factors.   Your doctor recommends that you have one.  BEFORE THE TEST  Do not schedule the test the week before your period, especially if your breasts are sore during this time.  On the day of your mammogram:  Wash your breasts and armpits well. After washing, do not put on any deodorant or talcum powder on until after your test.   Eat and drink as you usually do.   Take your medicines as usual.   If you are diabetic and take insulin, make sure you:   Eat before coming for your test.   Take your insulin as usual.   If you cannot keep your appointment, call before the appointment to cancel. Schedule another appointment.  TEST  You will need to undress from the waist up. You will put on a hospital gown.   Your breast will be put on the mammogram machine, and it will press firmly on your breast with a piece of plastic called a compression paddle. This will make your breast flatter so that the machine can X-ray all parts of your breast.   Both breasts will be X-rayed. Each breast will be X-rayed from above and from the side. An X-ray might need to be taken again if the picture is not good enough.   The mammogram will last about 15 to 30 minutes.  AFTER THE TEST Finding out the results of your test Ask when your test results will be ready. Make sure you get your test results.  Document Released: 11/28/2008 Document Revised: 08/21/2011 Document Reviewed: 11/28/2008 [facility] Patient Information 2012 [place].

## 2016-02-18 NOTE — Progress Notes (Signed)
    Penny Acosta Nov 02, 1970 [id number]        45 y.o.  G2P0002  for annual exam.  Doing well.  Past medical history,surgical history, problem list, medications, allergies, family history and social history were all reviewed and documented as reviewed in the EPIC chart.  ROS:  Performed with pertinent positives and negatives included in the history, assessment and plan.   Additional significant findings :  none   Exam: [name] assistant Filed Vitals:   02/18/16 0848  BP: 122/76  Height: 5\' 3"  (1.6 m)  Weight: 185 lb (83.915 kg)   General appearance:  Normal affect, orientation and appearance. Skin: Grossly normal HEENT: Without gross lesions.  No cervical or supraclavicular adenopathy. Thyroid normal.  Lungs:  Clear without wheezing, rales or rhonchi Cardiac: RR, without RMG Abdominal:  Soft, nontender, without masses, guarding, rebound, organomegaly or hernia Breasts:  Examined lying and sitting without masses, retractions, discharge or axillary adenopathy.  Well-healed bilateral reduction scars Pelvic:  Ext/BUS/vagina normal with small benign appearing skin papule 9:00 position perianal region  Cervix normal with IUD string visualized Pap smear done  Uterus anteverted, normal size, shape and contour, midline and mobile nontender   Adnexa without masses or tenderness    Anus and perineum normal   Rectovaginal normal sphincter tone without palpated masses or tenderness.    Assessment/Plan:  45 y.o. G21P0002 female for annual exam without menses, Mirena IUD.   1. Mirena IUD 10/2010.  Patient overdue to have replaced.  Recommended patient schedule an appointment ASAP and she agrees to do so. 2. Mammogram 04/2014.  Patient knows she is overdue and agrees to call and schedule. SBE monthly reviewed. 3. Pap smear 2013.Pap smear done today. No history of abnormal pap smears previously. 4. Health maintenance. Baseline CBC,CMP,lipid profile, urinalysis ordered. Follow up for IUD  replacement otherwise annual exam in one year.   Anastasio Auerbach MD, 9:10 AM 02/18/2016

## 2016-02-18 NOTE — Addendum Note (Signed)
Addended by: Nelva Nay on: 02/18/2016 09:19 AM   Modules accepted: Orders, SmartSet

## 2016-02-19 ENCOUNTER — Other Ambulatory Visit: Payer: Self-pay | Admitting: Gynecology

## 2016-02-19 ENCOUNTER — Telehealth: Payer: Self-pay | Admitting: Gynecology

## 2016-02-19 DIAGNOSIS — R7309 Other abnormal glucose: Secondary | ICD-10-CM

## 2016-02-19 DIAGNOSIS — R7989 Other specified abnormal findings of blood chemistry: Secondary | ICD-10-CM

## 2016-02-19 LAB — URINE CULTURE
COLONY COUNT: NO GROWTH
Organism ID, Bacteria: NO GROWTH

## 2016-02-19 LAB — PAP IG W/ RFLX HPV ASCU

## 2016-02-19 NOTE — Telephone Encounter (Signed)
02/19/16-Pt was advised today that her [facility] will cover the removal of old and insertion of new Mirena for contraception at 100%. Per Erica@BC [id number]

## 2016-03-06 ENCOUNTER — Encounter: Payer: Self-pay | Admitting: Gynecology

## 2016-03-06 ENCOUNTER — Ambulatory Visit (INDEPENDENT_AMBULATORY_CARE_PROVIDER_SITE_OTHER): Payer: BC Managed Care – PPO | Admitting: Gynecology

## 2016-03-06 VITALS — BP 114/72

## 2016-03-06 DIAGNOSIS — R7309 Other abnormal glucose: Secondary | ICD-10-CM

## 2016-03-06 DIAGNOSIS — R7989 Other specified abnormal findings of blood chemistry: Secondary | ICD-10-CM

## 2016-03-06 DIAGNOSIS — Z30433 Encounter for removal and reinsertion of intrauterine contraceptive device: Secondary | ICD-10-CM | POA: Diagnosis not present

## 2016-03-06 LAB — COMPREHENSIVE METABOLIC PANEL
ALBUMIN: 4.1 g/dL (ref 3.6–5.1)
ALK PHOS: 45 U/L (ref 33–115)
ALT: 14 U/L (ref 6–29)
AST: 17 U/L (ref 10–35)
BUN: 14 mg/dL (ref 7–25)
CO2: 23 mmol/L (ref 20–31)
CREATININE: 0.89 mg/dL (ref 0.50–1.10)
Calcium: 8.7 mg/dL (ref 8.6–10.2)
Chloride: 104 mmol/L (ref 98–110)
Glucose, Bld: 82 mg/dL (ref 65–99)
POTASSIUM: 4 mmol/L (ref 3.5–5.3)
SODIUM: 139 mmol/L (ref 135–146)
TOTAL PROTEIN: 6.9 g/dL (ref 6.1–8.1)
Total Bilirubin: 0.5 mg/dL (ref 0.2–1.2)

## 2016-03-06 LAB — HEMOGLOBIN A1C
Hgb A1c MFr Bld: 5.4 % (ref <5.7)
MEAN PLASMA GLUCOSE: 108 mg/dL

## 2016-03-06 NOTE — Progress Notes (Signed)
    Penny Acosta 1971-08-15 [id number]        46 y.o.  G2P0002  presents for replacement of her Mirena IUD. She has read through the booklet, has no contraindications and signed the consent form.  I reviewed the removal and reinsertional process with her as well as the risks to include infection, either immediate or long-term, uterine perforation or migration requiring surgery to remove, other complications such as pain, hormonal side effects and possibility of failure with subsequent pregnancy.   Exam with [name] assistant Filed Vitals:   03/06/16 0850  BP: 114/72    Pelvic: External BUS vagina normal. Cervix normal with IUD string visualized. Uterus anteverted normal size shape contour midline mobile nontender. Adnexa without masses or tenderness.  Procedure: The cervix visualized with a speculum and the IUD string was grasped with the [facility] forcep, removed, shown to the patient and discarded. The cervix was cleansed with Betadine, anterior lip grasped with a single-tooth tenaculum, the uterus was sounded and a Mirena IUD was placed according to manufacturer's recommendations without difficulty. The strings were trimmed. The patient tolerated well and will follow up in one month for a postinsertional check.  Lot number:  [id number]    Anastasio Auerbach MD, 9:15 AM 03/06/2016

## 2016-03-06 NOTE — Patient Instructions (Signed)
Intrauterine Device Insertion Most often, an intrauterine device (IUD) is inserted into the uterus to prevent pregnancy. There are 2 types of IUDs available:  Copper IUD--This type of IUD creates an environment that is not favorable to sperm survival. The mechanism of action of the copper IUD is not known for certain. It can stay in place for 10 years.  Hormone IUD--This type of IUD contains the hormone progestin (synthetic progesterone). The progestin thickens the cervical mucus and prevents sperm from entering the uterus, and it also thins the uterine lining. There is no evidence that the hormone IUD prevents implantation. One hormone IUD can stay in place for up to 5 years, and a different hormone IUD can stay in place for up to 3 years. An IUD is the most cost-effective birth control if left in place for the full duration. It may be removed at any time. LET YOUR HEALTH CARE PROVIDER KNOW ABOUT:  Any allergies you have.  All medicines you are taking, including vitamins, herbs, eye drops, creams, and over-the-counter medicines.  Previous problems you or members of your family have had with the use of anesthetics.  Any blood disorders you have.  Previous surgeries you have had.  Possibility of pregnancy.  Medical conditions you have. RISKS AND COMPLICATIONS  Generally, intrauterine device insertion is a safe procedure. However, as with any procedure, complications can occur. Possible complications include:  Accidental puncture (perforation) of the uterus.  Accidental placement of the IUD either in the muscle layer of the uterus (myometrium) or outside the uterus. If this happens, the IUD can be found essentially floating around the bowels and must be taken out surgically.  The IUD may fall out of the uterus (expulsion). This is more common in women who have recently had a child.   Pregnancy in the fallopian tube (ectopic).  Pelvic inflammatory disease (PID), which is infection of  the uterus and fallopian tubes. The risk of PID is slightly increased in the first 20 days after the IUD is placed, but the overall risk is still very low. BEFORE THE PROCEDURE  Schedule the IUD insertion for when you will have your menstrual period or right after, to make sure you are not pregnant. Placement of the IUD is better tolerated shortly after a menstrual cycle.  You may need to take tests or be examined to make sure you are not pregnant.  You may be required to take a pregnancy test.  You may be required to get checked for sexually transmitted infections (STIs) prior to placement. Placing an IUD in someone who has an infection can make the infection worse.  You may be given a pain reliever to take 1 or 2 hours before the procedure.  An exam will be performed to determine the size and position of your uterus.  Ask your health care provider about changing or stopping your regular medicines. PROCEDURE   A tool (speculum) is placed in the vagina. This allows your health care provider to see the lower part of the uterus (cervix).  The cervix is prepped with a medicine that lowers the risk of infection.  You may be given a medicine to numb each side of the cervix (intracervical or paracervical block). This is used to block and control any discomfort with insertion.  A tool (uterine sound) is inserted into the uterus to determine the length of the uterine cavity and the direction the uterus may be tilted.  A slim instrument (IUD inserter) is inserted through the cervical   canal and into your uterus.  The IUD is placed in the uterine cavity and the insertion device is removed.  The nylon string that is attached to the IUD and used for eventual IUD removal is trimmed. It is trimmed so that it lays high in the vagina, just outside the cervix. AFTER THE PROCEDURE  You may have bleeding after the procedure. This is normal. It varies from light spotting for a few days to menstrual-like  bleeding.  You may have mild cramping.   This information is not intended to replace advice given to you by your health care provider. Make sure you discuss any questions you have with your health care provider.   Document Released: 04/30/2011 Document Revised: 06/22/2013 Document Reviewed: 02/20/2013 Elsevier Interactive Patient Education 2016 Elsevier Inc.  

## 2016-04-10 ENCOUNTER — Ambulatory Visit (INDEPENDENT_AMBULATORY_CARE_PROVIDER_SITE_OTHER): Payer: BC Managed Care – PPO | Admitting: Gynecology

## 2016-04-10 ENCOUNTER — Encounter: Payer: Self-pay | Admitting: Gynecology

## 2016-04-10 VITALS — BP 114/70

## 2016-04-10 DIAGNOSIS — Z30431 Encounter for routine checking of intrauterine contraceptive device: Secondary | ICD-10-CM | POA: Diagnosis not present

## 2016-04-10 NOTE — Progress Notes (Signed)
    Penny Acosta 18-Sep-1970 [id number]        45 y.o.  G2P0002 presents for IUD follow up exam. Doing well without complaints  Past medical history,surgical history, problem list, medications, allergies, family history and social history were all reviewed and documented in the EPIC chart.  Directed ROS with pertinent positives and negatives documented in the history of present illness/assessment and plan.  Exam: Penny Acosta assistant Vitals:   04/10/16 0807  BP: 114/70   General appearance:  Normal Abdomen soft nontender without masses guarding rebound Pelvic external BUS vagina normal. Cervix normal. IUD string not visualized. Uterus normal size midline mobile nontender. Adnexa without masses or tenderness.  Using the colposcope and endocervical speculum the cervix was slightly dilated but I was unable to identify the string within the lower cervical canal.  Assessment/Plan:  45 y.o. G2P0002 with IUD follow up exam. IUD string not visualized. Recommend ultrasound for proper placement verification. Patient will schedule follow up for this.    Penny Auerbach MD, 8:21 AM 04/10/2016

## 2016-04-10 NOTE — Patient Instructions (Signed)
Follow up for ultrasound as scheduled 

## 2016-04-16 ENCOUNTER — Ambulatory Visit (INDEPENDENT_AMBULATORY_CARE_PROVIDER_SITE_OTHER): Payer: BC Managed Care – PPO

## 2016-04-16 ENCOUNTER — Encounter: Payer: Self-pay | Admitting: Gynecology

## 2016-04-16 ENCOUNTER — Ambulatory Visit (INDEPENDENT_AMBULATORY_CARE_PROVIDER_SITE_OTHER): Payer: BC Managed Care – PPO | Admitting: Gynecology

## 2016-04-16 ENCOUNTER — Other Ambulatory Visit: Payer: Self-pay | Admitting: Gynecology

## 2016-04-16 VITALS — BP 118/76

## 2016-04-16 DIAGNOSIS — Z30431 Encounter for routine checking of intrauterine contraceptive device: Secondary | ICD-10-CM

## 2016-04-16 DIAGNOSIS — N8311 Corpus luteum cyst of right ovary: Secondary | ICD-10-CM

## 2016-04-16 NOTE — Progress Notes (Signed)
    Penny Acosta [birth year] [id number]        45 y.o.  G2P0002 presents for ultrasound for IUD location.  Past medical history,surgical history, problem list, medications, allergies, family history and social history were all reviewed and documented in the EPIC chart.  Directed ROS with pertinent positives and negatives documented in the history of present illness/assessment and plan.  Exam:  Vitals:   04/16/16 0848  BP: 118/76   General appearance:  Normal  Ultrasound shows uterus normal size and echotexture. Endometrial echo 3.1 mm. IUD visualized in normal location. Right and left ovaries normal with physiologic changes. Cul-de-sac small amount of fluid 13 x 7 mm.  Assessment/Plan:  45 y.o. G2P0002 with ultrasound documenting normal location of IUD. Corpus luteal cyst noted on right ovary discussed with patient. Continue to monitor and follow up next year at annual exam, sooner if any issues.    Anastasio Auerbach MD, 9:01 AM 04/16/2016

## 2016-04-16 NOTE — Patient Instructions (Signed)
Follow up next year for your annual exam

## 2016-07-17 ENCOUNTER — Other Ambulatory Visit: Payer: Self-pay | Admitting: Gynecology

## 2016-07-17 DIAGNOSIS — Z1231 Encounter for screening mammogram for malignant neoplasm of breast: Secondary | ICD-10-CM

## 2016-08-01 ENCOUNTER — Ambulatory Visit
Admission: RE | Admit: 2016-08-01 | Discharge: 2016-08-01 | Disposition: A | Discharge status: Home / Self Care | Payer: BC Managed Care – PPO | Source: Ambulatory Visit | Attending: Gynecology | Admitting: Gynecology

## 2016-08-01 DIAGNOSIS — Z1231 Encounter for screening mammogram for malignant neoplasm of breast: Secondary | ICD-10-CM

## 2017-02-19 ENCOUNTER — Encounter: Payer: Self-pay | Admitting: Gynecology

## 2017-02-19 ENCOUNTER — Ambulatory Visit (INDEPENDENT_AMBULATORY_CARE_PROVIDER_SITE_OTHER): Payer: BC Managed Care – PPO | Admitting: Gynecology

## 2017-02-19 VITALS — BP 116/74 | Ht 63.0 in | Wt 194.0 lb

## 2017-02-19 DIAGNOSIS — Z01419 Encounter for gynecological examination (general) (routine) without abnormal findings: Secondary | ICD-10-CM

## 2017-02-19 DIAGNOSIS — Z13220 Encounter for screening for lipoid disorders: Secondary | ICD-10-CM

## 2017-02-19 DIAGNOSIS — N951 Menopausal and female climacteric states: Secondary | ICD-10-CM

## 2017-02-19 LAB — CBC WITH DIFFERENTIAL/PLATELET
BASOS ABS: 54 {cells}/uL (ref 0–200)
BASOS PCT: 1 %
Eosinophils Absolute: 162 cells/uL (ref 15–500)
Eosinophils Relative: 3 %
HEMATOCRIT: 41.4 % (ref 35.0–45.0)
HEMOGLOBIN: 13.6 g/dL (ref 11.7–15.5)
LYMPHS ABS: 1404 {cells}/uL (ref 850–3900)
Lymphocytes Relative: 26 %
MCH: 29.6 pg (ref 27.0–33.0)
MCHC: 32.9 g/dL (ref 32.0–36.0)
MCV: 90.2 fL (ref 80.0–100.0)
MONO ABS: 594 {cells}/uL (ref 200–950)
MONOS PCT: 11 %
MPV: 9.2 fL (ref 7.5–12.5)
NEUTROS ABS: 3186 {cells}/uL (ref 1500–7800)
Neutrophils Relative %: 59 %
PLATELETS: 263 10*3/uL (ref 140–400)
RBC: 4.59 MIL/uL (ref 3.80–5.10)
RDW: 13.7 % (ref 11.0–15.0)
WBC: 5.4 10*3/uL (ref 3.8–10.8)

## 2017-02-19 LAB — LIPID PANEL
CHOL/HDL RATIO: 4.8 ratio (ref <5.0)
CHOLESTEROL: 231 mg/dL — AB (ref <200)
HDL: 48 mg/dL — ABNORMAL LOW (ref >50)
LDL Cholesterol: 167 mg/dL — ABNORMAL HIGH (ref <100)
TRIGLYCERIDES: 78 mg/dL (ref <150)
VLDL: 16 mg/dL (ref <30)

## 2017-02-19 LAB — COMPREHENSIVE METABOLIC PANEL
ALBUMIN: 4.1 g/dL (ref 3.6–5.1)
ALK PHOS: 33 U/L (ref 33–115)
ALT: 17 U/L (ref 6–29)
AST: 19 U/L (ref 10–35)
BUN: 15 mg/dL (ref 7–25)
CALCIUM: 8.6 mg/dL (ref 8.6–10.2)
CHLORIDE: 104 mmol/L (ref 98–110)
CO2: 22 mmol/L (ref 20–31)
Creat: 0.85 mg/dL (ref 0.50–1.10)
Glucose, Bld: 86 mg/dL (ref 65–99)
POTASSIUM: 3.8 mmol/L (ref 3.5–5.3)
Sodium: 138 mmol/L (ref 135–146)
Total Bilirubin: 0.6 mg/dL (ref 0.2–1.2)
Total Protein: 6.9 g/dL (ref 6.1–8.1)

## 2017-02-19 LAB — TSH: TSH: 2.5 mIU/L

## 2017-02-19 LAB — FOLLICLE STIMULATING HORMONE: FSH: 6.8 m[IU]/mL

## 2017-02-19 NOTE — Progress Notes (Signed)
    Penny Acosta [birth year] [id number]        46 y.o.  G2P0002 for annual exam.  Doing well without complaints  Past medical history,surgical history, problem list, medications, allergies, family history and social history were all reviewed and documented as reviewed in the EPIC chart.  ROS:  Performed with pertinent positives and negatives included in the history, assessment and plan.   Additional significant findings :  None   Exam: [name] assistant Vitals:   02/19/17 0814  BP: 116/74  Weight: 194 lb (88 kg)  Height: 5\' 3"  (1.6 m)   Body mass index is 34.37 kg/m.  General appearance:  Normal affect, orientation and appearance. Skin: Grossly normal HEENT: Without gross lesions.  No cervical or supraclavicular adenopathy. Thyroid normal.  Lungs:  Clear without wheezing, rales or rhonchi Cardiac: RR, without RMG Abdominal:  Soft, nontender, without masses, guarding, rebound, organomegaly or hernia Breasts:  Examined lying and sitting without masses, retractions, discharge or axillary adenopathy.  Well-healed bilateral reduction scars Pelvic:  Ext, BUS, Vagina: Normal  Cervix: Normal. IUD string visualized  Uterus: Anteverted, normal size, shape and contour, midline and mobile nontender   Adnexa: Without masses or tenderness    Anus and perineum: Normal   Rectovaginal: Normal sphincter tone without palpated masses or tenderness.    Assessment/Plan:  46 y.o. G9P0002 female for annual exam without menses, Mirena IUD.   1. Menopausal symptoms. Patient notes some hot flushes and sweats during the evenings. Not during the days. No skin or hair changes. No weight changes, nausea, vomiting, diarrhea, constipation. Check baseline FSH TSH. 2. Mirena IUD 02/2016. Doing well without menses. 3. Pap smear 2017. No Pap smear done today. No history of significant abnormal Pap smears. Plan repeat Pap smear at 3 year interval per current screening guidelines. 4. Mammography 07/2016.  Breast exam normal today. Continue with annual mammography when due. SBE monthly reviewed. 5. Health maintenance. Patient requests baseline fasting labs. CBC, CMP, lipid profile ordered with above lab work.   Anastasio Auerbach MD, 8:42 AM 02/19/2017

## 2017-02-19 NOTE — Patient Instructions (Signed)
Follow up in one year for annual exam 

## 2017-06-08 ENCOUNTER — Ambulatory Visit (INDEPENDENT_AMBULATORY_CARE_PROVIDER_SITE_OTHER): Payer: BC Managed Care – PPO | Admitting: Gynecology

## 2017-06-08 ENCOUNTER — Encounter: Payer: Self-pay | Admitting: Gynecology

## 2017-06-08 VITALS — BP 110/60

## 2017-06-08 DIAGNOSIS — B373 Candidiasis of vulva and vagina: Secondary | ICD-10-CM | POA: Diagnosis not present

## 2017-06-08 DIAGNOSIS — L298 Other pruritus: Secondary | ICD-10-CM | POA: Diagnosis not present

## 2017-06-08 DIAGNOSIS — B3731 Acute candidiasis of vulva and vagina: Secondary | ICD-10-CM

## 2017-06-08 DIAGNOSIS — N898 Other specified noninflammatory disorders of vagina: Secondary | ICD-10-CM

## 2017-06-08 LAB — WET PREP FOR TRICH, YEAST, CLUE

## 2017-06-08 MED ORDER — FLUCONAZOLE 150 MG PO TABS: 150.0000 mg | ORAL_TABLET | Freq: Once | ORAL | 0 refills | Status: AC

## 2017-06-08 NOTE — Addendum Note (Signed)
Addended by: Anastasio Auerbach on: 06/08/2017 04:39 PM   Modules accepted: Orders

## 2017-06-08 NOTE — Progress Notes (Signed)
    Penny Acosta Aug 01, 1971 [id number]        46 y.o.  G2P0002 presents with several days of vulvar itching. No real discharge, odor, UTI symptoms such as frequency dysuria or urgency low back pain fever or chills. No nausea vomiting diarrhea constipation.  Past medical history,surgical history, problem list, medications, allergies, family history and social history were all reviewed and documented in the EPIC chart.  Directed ROS with pertinent positives and negatives documented in the history of present illness/assessment and plan.  Exam: [name] assistant Vitals:   06/08/17 1521  BP: 110/60   General appearance:  Normal Abdomen soft nontender without masses guarding rebound Pelvic external BUS vagina with scant discharge. Cervix normal. IUD string visualized. Uterus normal size midline mobile nontender. Adnexa without masses or tenderness.  Assessment/Plan:  46 y.o. [id number] with symptoms and wet prep consistent with yeast vaginitis. Will treat with Diflucan 150 mg 1 dose. [place] and I gave her a second Diflucan to have available if she still has symptoms in several days to repeat the dose. She'll follow up if her symptoms persist, worsen or recur.    Anastasio Auerbach MD, 3:43 PM 06/08/2017

## 2017-06-08 NOTE — Patient Instructions (Signed)
Take the one Diflucan pill. Repeated in several days if you still have symptoms.

## 2017-06-17 ENCOUNTER — Telehealth: Payer: Self-pay | Admitting: *Deleted

## 2017-06-17 MED ORDER — TERCONAZOLE 0.8 % VA CREA: 1.0000 | TOPICAL_CREAM | Freq: Every day | VAGINAL | 0 refills | Status: DC

## 2017-06-17 NOTE — Telephone Encounter (Signed)
Terazol 3 day cream

## 2017-06-17 NOTE — Telephone Encounter (Signed)
Pt was treated with yeast infection on 06/08/17 with diflucan tablets, better but not 100% still has slight internal itching and external. Pt asked if Rx could be sent? Please advise

## 2017-06-17 NOTE — Telephone Encounter (Signed)
Rx sent 

## 2017-07-30 ENCOUNTER — Other Ambulatory Visit: Payer: Self-pay | Admitting: Gynecology

## 2017-07-30 DIAGNOSIS — E7800 Pure hypercholesterolemia, unspecified: Secondary | ICD-10-CM

## 2017-11-17 ENCOUNTER — Other Ambulatory Visit: Payer: Self-pay | Admitting: Gynecology

## 2017-11-17 DIAGNOSIS — Z1231 Encounter for screening mammogram for malignant neoplasm of breast: Secondary | ICD-10-CM

## 2017-11-25 ENCOUNTER — Ambulatory Visit
Admission: RE | Admit: 2017-11-25 | Discharge: 2017-11-25 | Disposition: A | Discharge status: Home / Self Care | Payer: BC Managed Care – PPO | Source: Ambulatory Visit | Attending: Gynecology | Admitting: Gynecology

## 2017-11-25 DIAGNOSIS — Z1231 Encounter for screening mammogram for malignant neoplasm of breast: Secondary | ICD-10-CM | POA: Diagnosis present

## 2018-02-24 ENCOUNTER — Encounter: Payer: Self-pay | Admitting: Gynecology

## 2018-02-24 ENCOUNTER — Ambulatory Visit: Payer: BC Managed Care – PPO | Admitting: Gynecology

## 2018-02-24 VITALS — BP 118/76 | Ht 62.5 in | Wt 215.0 lb

## 2018-02-24 DIAGNOSIS — R5383 Other fatigue: Secondary | ICD-10-CM | POA: Diagnosis not present

## 2018-02-24 DIAGNOSIS — E7800 Pure hypercholesterolemia, unspecified: Secondary | ICD-10-CM | POA: Diagnosis not present

## 2018-02-24 DIAGNOSIS — Z01419 Encounter for gynecological examination (general) (routine) without abnormal findings: Secondary | ICD-10-CM

## 2018-02-24 DIAGNOSIS — Z30431 Encounter for routine checking of intrauterine contraceptive device: Secondary | ICD-10-CM | POA: Diagnosis not present

## 2018-02-24 NOTE — Progress Notes (Signed)
    Penny Acosta July 04, 1971 [id number]        47 y.o.  G2P0002 for annual gynecologic exam.  Complaining of fatigue.  Trying to eat a proper diet and exercise.  Has 2 younger children at home.  No significant weight changes hair or skin changes.  No nausea vomiting diarrhea constipation.  Some hot flushes at night.  Past medical history,surgical history, problem list, medications, allergies, family history and social history were all reviewed and documented as reviewed in the EPIC chart.  ROS:  Performed with pertinent positives and negatives included in the history, assessment and plan.   Additional significant findings : None   Exam: [name] assistant Vitals:   02/24/18 0803  BP: 118/76  Weight: 215 lb (97.5 kg)  Height: 5' 2.5" (1.588 m)   Body mass index is 38.7 kg/m.  General appearance:  Normal affect, orientation and appearance. Skin: Grossly normal HEENT: Without gross lesions.  No cervical or supraclavicular adenopathy. Thyroid normal.  Lungs:  Clear without wheezing, rales or rhonchi Cardiac: RR, without RMG Abdominal:  Soft, nontender, without masses, guarding, rebound, organomegaly or hernia Breasts:  Examined lying and sitting without masses, retractions, discharge or axillary adenopathy.  Well-healed bilateral reduction scars Pelvic:  Ext, BUS, Vagina: Normal  Cervix: Normal.  IUD string visualized in endocervical canal after gentle dilatation with disposable dilator and using endocervical speculum and colposcopy.  Uterus: Anteverted, normal size, shape and contour, midline and mobile nontender   Adnexa: Without masses or tenderness    Anus and perineum: Normal   Rectovaginal: Normal sphincter tone without palpated masses or tenderness.    Assessment/Plan:  47 y.o. G47P0002 female for annual gynecologic exam without menses, Mirena IUD.   1. Fatigue/night sweats.  Suspect fatigue related to lifestyle with 2 small children.  No physical changes such as weight  changes hair skin.  Will check baseline CBC comprehensive metabolic panel vitamin D thyroid and FSH given the night sweats.  Assuming normal then will work more on diet/exercise. 2. Mirena IUD 02/2016.  Doing well without menses.  IUD string visualized using endocervical speculum. 3. Mammography 11/2017.  Continue with annual mammography when due.  Breast exam normal today. 4. Pap smear 02/2016.  No Pap smear done today.  No history of abnormal Pap smears.  Plan repeat Pap smear next year at 3-year interval per current screening guidelines. 5. Health maintenance history of hypercholesterolemia.  Check fasting lipid profile today along with above blood work.  Follow-up in 1 year, sooner as needed.   Anastasio Auerbach MD, 8:33 AM 02/24/2018

## 2018-02-24 NOTE — Patient Instructions (Signed)
Follow-up in 1 year for annual exam, sooner if any issues. 

## 2018-02-25 ENCOUNTER — Encounter: Payer: Self-pay | Admitting: Gynecology

## 2018-02-25 LAB — COMPREHENSIVE METABOLIC PANEL
AG RATIO: 1.5 (calc) (ref 1.0–2.5)
ALBUMIN MSPROF: 3.9 g/dL (ref 3.6–5.1)
ALT: 23 U/L (ref 6–29)
AST: 22 U/L (ref 10–35)
Alkaline phosphatase (APISO): 46 U/L (ref 33–115)
BUN: 12 mg/dL (ref 7–25)
CHLORIDE: 104 mmol/L (ref 98–110)
CO2: 26 mmol/L (ref 20–32)
CREATININE: 0.8 mg/dL (ref 0.50–1.10)
Calcium: 8.6 mg/dL (ref 8.6–10.2)
GLOBULIN: 2.6 g/dL (ref 1.9–3.7)
GLUCOSE: 94 mg/dL (ref 65–99)
POTASSIUM: 4 mmol/L (ref 3.5–5.3)
SODIUM: 136 mmol/L (ref 135–146)
Total Bilirubin: 0.5 mg/dL (ref 0.2–1.2)
Total Protein: 6.5 g/dL (ref 6.1–8.1)

## 2018-02-25 LAB — LIPID PANEL
CHOL/HDL RATIO: 3.2 (calc) (ref <5.0)
Cholesterol: 171 mg/dL (ref <200)
HDL: 53 mg/dL (ref >50)
LDL Cholesterol (Calc): 101 mg/dL (calc) — ABNORMAL HIGH
Non-HDL Cholesterol (Calc): 118 mg/dL (calc) (ref <130)
Triglycerides: 76 mg/dL (ref <150)

## 2018-02-25 LAB — CBC WITH DIFFERENTIAL/PLATELET
BASOS PCT: 1.1 %
Basophils Absolute: 58 cells/uL (ref 0–200)
EOS ABS: 217 {cells}/uL (ref 15–500)
Eosinophils Relative: 4.1 %
HCT: 37.7 % (ref 35.0–45.0)
HEMOGLOBIN: 13 g/dL (ref 11.7–15.5)
Lymphs Abs: 1383 cells/uL (ref 850–3900)
MCH: 29.7 pg (ref 27.0–33.0)
MCHC: 34.5 g/dL (ref 32.0–36.0)
MCV: 86.1 fL (ref 80.0–100.0)
MONOS PCT: 9.6 %
MPV: 9.8 fL (ref 7.5–12.5)
NEUTROS ABS: 3132 {cells}/uL (ref 1500–7800)
Neutrophils Relative %: 59.1 %
PLATELETS: 245 10*3/uL (ref 140–400)
RBC: 4.38 10*6/uL (ref 3.80–5.10)
RDW: 12.3 % (ref 11.0–15.0)
TOTAL LYMPHOCYTE: 26.1 %
WBC: 5.3 10*3/uL (ref 3.8–10.8)
WBCMIX: 509 {cells}/uL (ref 200–950)

## 2018-02-25 LAB — FOLLICLE STIMULATING HORMONE: FSH: 7.5 m[IU]/mL

## 2018-02-25 LAB — THYROID PANEL WITH TSH
Free Thyroxine Index: 2.3 (ref 1.4–3.8)
T3 Uptake: 31 % (ref 22–35)
T4 TOTAL: 7.5 ug/dL (ref 5.1–11.9)
TSH: 2.1 mIU/L

## 2018-02-25 LAB — VITAMIN D 25 HYDROXY (VIT D DEFICIENCY, FRACTURES): VIT D 25 HYDROXY: 38 ng/mL (ref 30–100)

## 2019-02-28 ENCOUNTER — Encounter: Payer: BC Managed Care – PPO | Admitting: Gynecology

## 2019-03-09 ENCOUNTER — Encounter: Payer: BC Managed Care – PPO | Admitting: Gynecology

## 2019-03-25 ENCOUNTER — Encounter: Payer: BC Managed Care – PPO | Admitting: Gynecology

## 2019-03-30 ENCOUNTER — Other Ambulatory Visit: Payer: Self-pay

## 2019-03-31 ENCOUNTER — Ambulatory Visit: Payer: BC Managed Care – PPO | Admitting: Gynecology

## 2019-03-31 ENCOUNTER — Encounter: Payer: Self-pay | Admitting: Gynecology

## 2019-03-31 VITALS — BP 118/76 | Ht 62.0 in | Wt 194.0 lb

## 2019-03-31 DIAGNOSIS — Z30431 Encounter for routine checking of intrauterine contraceptive device: Secondary | ICD-10-CM

## 2019-03-31 DIAGNOSIS — N951 Menopausal and female climacteric states: Secondary | ICD-10-CM | POA: Diagnosis not present

## 2019-03-31 DIAGNOSIS — Z13220 Encounter for screening for lipoid disorders: Secondary | ICD-10-CM

## 2019-03-31 DIAGNOSIS — Z01419 Encounter for gynecological examination (general) (routine) without abnormal findings: Secondary | ICD-10-CM

## 2019-03-31 NOTE — Patient Instructions (Signed)
Follow-up in 1 year for annual exam, sooner if any issues. 

## 2019-03-31 NOTE — Addendum Note (Signed)
Addended by: Anastasio Auerbach on: 03/31/2019 04:32 PM   Modules accepted: Orders

## 2019-03-31 NOTE — Progress Notes (Addendum)
    Penny Acosta 12-Aug-1971 [id number]        48 y.o.  G2P0002 for annual gynecologic exam.  Without gynecologic complaints  Past medical history,surgical history, problem list, medications, allergies, family history and social history were all reviewed and documented as reviewed in the EPIC chart.  ROS:  Performed with pertinent positives and negatives included in the history, assessment and plan.   Additional significant findings : None   Exam: [name] assistant Vitals:   03/31/19 1552  BP: 118/76  Weight: 194 lb (88 kg)  Height: 5\' 2"  (1.575 m)   Body mass index is 35.48 kg/m.  General appearance:  Normal affect, orientation and appearance. Skin: Grossly normal HEENT: Without gross lesions.  No cervical or supraclavicular adenopathy. Thyroid normal.  Lungs:  Clear without wheezing, rales or rhonchi Cardiac: RR, without RMG Abdominal:  Soft, nontender, without masses, guarding, rebound, organomegaly or hernia Breasts:  Examined lying and sitting without masses, retractions, discharge or axillary adenopathy. Pelvic:  Ext, BUS, Vagina: Normal  Cervix: Normal.  IUD string visualized.  Pap smear done  Uterus: Anteverted, normal size, shape and contour, midline and mobile nontender   Adnexa: Without masses or tenderness    Anus and perineum: Normal   Rectovaginal: Normal sphincter tone without palpated masses or tenderness.    Assessment/Plan:  48 y.o. G61P0002 female for annual gynecologic exam.  Without menses, Mirena IUD  1. Mirena IUD 02/2016.  Doing well without menses.  IUD string visualized. 2. Pap smear 2017.  Pap smear done today.  No history of abnormal Pap smears previously. 3. Mammography due now and patient is in the process of arranging.  Breast exam normal today. 4. Colonoscopy 2020.  Repeat at their recommended interval. 5. Health maintenance.  Patient asked if I could do baseline labs.  Did not have them done this year at her primary physician as usual.   CBC, CMP, lipid profile ordered.  We will go ahead and check [name] also as she is having some hot flushes and sweats and will see where she stands from a menopausal standpoint given she is not having menses from her Mirena IUD.  Follow-up 1 year, sooner as needed.   Anastasio Auerbach MD, 4:26 PM 03/31/2019

## 2019-03-31 NOTE — Addendum Note (Signed)
Addended by: Nelva Nay on: 03/31/2019 04:34 PM   Modules accepted: Orders

## 2019-04-01 ENCOUNTER — Other Ambulatory Visit: Payer: Self-pay | Admitting: Gynecology

## 2019-04-01 ENCOUNTER — Encounter: Payer: Self-pay | Admitting: Gynecology

## 2019-04-01 DIAGNOSIS — R7309 Other abnormal glucose: Secondary | ICD-10-CM

## 2019-04-01 DIAGNOSIS — E7800 Pure hypercholesterolemia, unspecified: Secondary | ICD-10-CM

## 2019-04-01 LAB — CBC WITH DIFFERENTIAL/PLATELET
Absolute Monocytes: 580 cells/uL (ref 200–950)
Basophils Absolute: 63 cells/uL (ref 0–200)
Basophils Relative: 1 %
Eosinophils Absolute: 252 cells/uL (ref 15–500)
Eosinophils Relative: 4 %
HCT: 39.3 % (ref 35.0–45.0)
Hemoglobin: 12.9 g/dL (ref 11.7–15.5)
Lymphs Abs: 1991 cells/uL (ref 850–3900)
MCH: 29.5 pg (ref 27.0–33.0)
MCHC: 32.8 g/dL (ref 32.0–36.0)
MCV: 89.7 fL (ref 80.0–100.0)
MPV: 9.5 fL (ref 7.5–12.5)
Monocytes Relative: 9.2 %
Neutro Abs: 3415 cells/uL (ref 1500–7800)
Neutrophils Relative %: 54.2 %
Platelets: 263 10*3/uL (ref 140–400)
RBC: 4.38 10*6/uL (ref 3.80–5.10)
RDW: 12.1 % (ref 11.0–15.0)
Total Lymphocyte: 31.6 %
WBC: 6.3 10*3/uL (ref 3.8–10.8)

## 2019-04-01 LAB — LIPID PANEL
Cholesterol: 184 mg/dL (ref <200)
HDL: 54 mg/dL (ref >=50)
LDL Cholesterol (Calc): 110 mg/dL (calc) — ABNORMAL HIGH
Non-HDL Cholesterol (Calc): 130 mg/dL (calc) — ABNORMAL HIGH (ref <130)
Total CHOL/HDL Ratio: 3.4 (calc) (ref <5.0)
Triglycerides: 95 mg/dL (ref <150)

## 2019-04-01 LAB — COMPREHENSIVE METABOLIC PANEL
AG Ratio: 1.4 (calc) (ref 1.0–2.5)
ALT: 18 U/L (ref 6–29)
AST: 19 U/L (ref 10–35)
Albumin: 3.9 g/dL (ref 3.6–5.1)
Alkaline phosphatase (APISO): 43 U/L (ref 37–153)
BUN: 22 mg/dL (ref 7–25)
CO2: 19 mmol/L — ABNORMAL LOW (ref 20–32)
Calcium: 9.1 mg/dL (ref 8.6–10.4)
Chloride: 108 mmol/L (ref 98–110)
Creat: 0.85 mg/dL (ref 0.50–1.05)
Globulin: 2.8 g/dL (calc) (ref 1.9–3.7)
Glucose, Bld: 118 mg/dL — ABNORMAL HIGH (ref 65–99)
Potassium: 3.6 mmol/L (ref 3.5–5.3)
Sodium: 139 mmol/L (ref 135–146)
Total Bilirubin: 0.3 mg/dL (ref 0.2–1.2)
Total Protein: 6.7 g/dL (ref 6.1–8.1)

## 2019-04-01 LAB — FOLLICLE STIMULATING HORMONE: FSH: 10.1 m[IU]/mL

## 2019-04-04 LAB — PAP IG W/ RFLX HPV ASCU

## 2019-04-07 ENCOUNTER — Encounter: Payer: Self-pay | Admitting: Gynecology

## 2019-05-19 ENCOUNTER — Other Ambulatory Visit: Payer: Self-pay

## 2019-05-19 DIAGNOSIS — Z20822 Contact with and (suspected) exposure to covid-19: Secondary | ICD-10-CM

## 2019-05-20 LAB — NOVEL CORONAVIRUS, NAA: SARS-CoV-2, NAA: NOT DETECTED

## 2019-06-08 ENCOUNTER — Encounter: Payer: Self-pay | Admitting: Gynecology

## 2019-08-03 ENCOUNTER — Other Ambulatory Visit: Payer: Self-pay | Admitting: *Deleted

## 2019-08-03 DIAGNOSIS — Z20822 Contact with and (suspected) exposure to covid-19: Secondary | ICD-10-CM

## 2019-08-05 LAB — NOVEL CORONAVIRUS, NAA: SARS-CoV-2, NAA: NOT DETECTED

## 2019-10-27 ENCOUNTER — Other Ambulatory Visit: Payer: Self-pay | Admitting: Gynecology

## 2019-10-27 DIAGNOSIS — Z1231 Encounter for screening mammogram for malignant neoplasm of breast: Secondary | ICD-10-CM

## 2019-12-08 ENCOUNTER — Ambulatory Visit
Admission: RE | Admit: 2019-12-08 | Discharge: 2019-12-08 | Disposition: A | Discharge status: Home / Self Care | Payer: BC Managed Care – PPO | Source: Ambulatory Visit | Attending: Family Medicine | Admitting: Family Medicine

## 2019-12-08 DIAGNOSIS — Z1231 Encounter for screening mammogram for malignant neoplasm of breast: Secondary | ICD-10-CM | POA: Insufficient documentation

## 2019-12-14 ENCOUNTER — Other Ambulatory Visit: Payer: Self-pay | Admitting: Family Medicine

## 2019-12-14 DIAGNOSIS — R928 Other abnormal and inconclusive findings on diagnostic imaging of breast: Secondary | ICD-10-CM

## 2019-12-14 DIAGNOSIS — R921 Mammographic calcification found on diagnostic imaging of breast: Secondary | ICD-10-CM

## 2019-12-23 ENCOUNTER — Ambulatory Visit
Admission: RE | Admit: 2019-12-23 | Discharge: 2019-12-23 | Disposition: A | Discharge status: Home / Self Care | Payer: BC Managed Care – PPO | Source: Ambulatory Visit | Attending: Family Medicine | Admitting: Family Medicine

## 2019-12-23 DIAGNOSIS — R928 Other abnormal and inconclusive findings on diagnostic imaging of breast: Secondary | ICD-10-CM | POA: Diagnosis present

## 2019-12-23 DIAGNOSIS — R921 Mammographic calcification found on diagnostic imaging of breast: Secondary | ICD-10-CM | POA: Diagnosis present

## 2019-12-28 ENCOUNTER — Other Ambulatory Visit: Payer: Self-pay | Admitting: Family Medicine

## 2019-12-28 DIAGNOSIS — R921 Mammographic calcification found on diagnostic imaging of breast: Secondary | ICD-10-CM

## 2019-12-28 DIAGNOSIS — R928 Other abnormal and inconclusive findings on diagnostic imaging of breast: Secondary | ICD-10-CM

## 2020-01-03 ENCOUNTER — Ambulatory Visit
Admission: RE | Admit: 2020-01-03 | Discharge: 2020-01-03 | Disposition: A | Discharge status: Home / Self Care | Payer: BC Managed Care – PPO | Source: Ambulatory Visit | Attending: Family Medicine | Admitting: Family Medicine

## 2020-01-03 DIAGNOSIS — R921 Mammographic calcification found on diagnostic imaging of breast: Secondary | ICD-10-CM | POA: Insufficient documentation

## 2020-01-03 DIAGNOSIS — R928 Other abnormal and inconclusive findings on diagnostic imaging of breast: Secondary | ICD-10-CM | POA: Diagnosis present

## 2020-01-03 HISTORY — PX: BREAST BIOPSY: SHX20

## 2020-01-04 ENCOUNTER — Other Ambulatory Visit: Payer: Self-pay | Admitting: Pathology

## 2020-01-04 DIAGNOSIS — D0512 Intraductal carcinoma in situ of left breast: Secondary | ICD-10-CM

## 2020-01-04 LAB — SURGICAL PATHOLOGY

## 2020-01-05 NOTE — Progress Notes (Signed)
Contacted patient to initiate navigation. Reviewed available pathology.  Scheduled Med/Onc and Surgical consults for 01/10/20.  Will accompany to consult with Dr. Janese Banks.

## 2020-01-10 ENCOUNTER — Encounter: Payer: Self-pay | Admitting: Oncology

## 2020-01-10 ENCOUNTER — Ambulatory Visit: Payer: Self-pay | Admitting: General Surgery

## 2020-01-10 ENCOUNTER — Inpatient Hospital Stay: Payer: BC Managed Care – PPO

## 2020-01-10 ENCOUNTER — Inpatient Hospital Stay: Payer: BC Managed Care – PPO | Attending: Oncology | Admitting: Oncology

## 2020-01-10 ENCOUNTER — Other Ambulatory Visit: Payer: Self-pay

## 2020-01-10 VITALS — BP 107/58 | HR 72 | Temp 97.8°F | Resp 16 | Wt 193.1 lb

## 2020-01-10 DIAGNOSIS — D0512 Intraductal carcinoma in situ of left breast: Secondary | ICD-10-CM | POA: Diagnosis present

## 2020-01-10 DIAGNOSIS — Z7189 Other specified counseling: Secondary | ICD-10-CM

## 2020-01-10 NOTE — H&P (Signed)
PATIENT PROFILE: Penny Acosta is a 49 y.o. female who presents to the Clinic for consultation at the request of Dr. Netty Starring for evaluation of left breast cancer.  PCP:  Dion Body, MD  HISTORY OF PRESENT ILLNESS: Penny Acosta reports she had her usual screening mammogram.  Screening mammogram shows calcification of the left breast.  These led to diagnostic mammogram and ultrasound.  Continued fixation were confirmed.  Core biopsy of the calcification was done showing a carcinoma in situ we did area of concern of focus of micro invasion.  Family history of breast cancer: None Family history of other cancers: Mother with pancreatic cancer Menarche: 9 years old Menopause: 49 years old (using Mirena) Used OCP: Mirena Used estrogen and progesterone therapy: Mirena History of Radiation to the chest: None Number of pregnancies: 1 (twins) Age of first pregnancy: 18  PROBLEM LIST:        Problem List  Date Reviewed: 10/27/2019       Noted   Class 1 obesity due to excess calories without serious comorbidity with body mass index (BMI) of 34.0 to 34.9 in adult Unknown      GENERAL REVIEW OF SYSTEMS:   General ROS: negative for - chills, fatigue, fever, weight gain or weight loss Allergy and Immunology ROS: negative for - hives  Hematological and Lymphatic ROS: negative for - bleeding problems or bruising, negative for palpable nodes Endocrine ROS: negative for - heat or cold intolerance, hair changes Respiratory ROS: negative for - cough, shortness of breath or wheezing Cardiovascular ROS: no chest pain or palpitations GI ROS: negative for nausea, vomiting, abdominal pain, diarrhea, constipation Musculoskeletal ROS: negative for - joint swelling or muscle pain Neurological ROS: negative for - confusion, syncope.  Positive for headache Dermatological ROS: negative for pruritus and rash Psychiatric: negative for depression, difficulty sleeping and memory loss.  Positive  for anxiety  MEDICATIONS: Current Medications        Current Outpatient Medications  Medication Sig Dispense Refill  . benzonatate (TESSALON) 200 MG capsule Take 1 capsule (200 mg total) by mouth 3 (three) times daily as needed for Cough 30 capsule 0  . cyclobenzaprine (FLEXERIL) 5 MG tablet Take by mouth as needed    . ibuprofen (MOTRIN) 400 MG tablet Take by mouth as needed    . levonorgestrel (MIRENA) 20 mcg/24 hr (5 years) IUD Insert 1 each into the uterus once       . lidocaine-prilocaine (EMLA) cream Apply topically once for 1 dose 5 g 0   No current facility-administered medications for this visit.      ALLERGIES: Patient has no known allergies.  PAST MEDICAL HISTORY:     Past Medical History:  Diagnosis Date  . Anxiety   . Cervicalgia   . Depression   . History of cancer 01/04/20  . History of postpartum depression    and anxiety  . Insomnia    stable  . Obesity     PAST SURGICAL HISTORY:      Past Surgical History:  Procedure Laterality Date  .  Bilateral breast reduction, 1994    . CESAREAN SECTION  2007  . COLONOSCOPY  03/25/2019   Normal colon/FHx CP-Father/Repeat 71yrs/TKT     FAMILY HISTORY:      Family History  Problem Relation Age of Onset  . Pancreatic cancer Mother   . Hypotension Mother   . Diabetes type II Father   . Heart failure Father   . Hypotension Father   . Parkinsonism Father   .  Colon polyps Father 59  . Prostate cancer Father   . Alcohol abuse Maternal Grandfather   . Coronary Artery Disease (Blocked arteries around heart) Maternal Grandmother      SOCIAL HISTORY: Social History          Socioeconomic History  . Marital status: Married    Spouse name: Not on file  . Number of children: Not on file  . Years of education: Not on file  . Highest education level: Not on file  Occupational History  . 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 Smoking  status: Never Smoker  . Smokeless tobacco: Never Used  Vaping Use  . Vaping Use: Never used  Substance and Sexual Activity  . Alcohol use: Yes    Comment: occasional wine  . Drug use: No  . Sexual activity: Yes    Partners: Male    Birth control/protection: I.U.D.  Other Topics Concern  . 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 Difficulty of Paying Living Expenses:   Food Insecurity:   . Worried About Charity fundraiser in the Last Year:   . Arboriculturist in the Last Year:   Transportation Needs:   . Film/video editor (Medical):   Marland Kitchen Lack of Transportation (Non-Medical):       PHYSICAL EXAM:    Vitals:   01/10/20 1452  BP: 98/67  Pulse: 69   Body mass index is 34.19 kg/m. Weight: 87.5 kg (193 lb)   GENERAL: Alert, active, oriented x3  HEENT: Pupils equal reactive to light. Extraocular movements are intact. Sclera clear. Palpebral conjunctiva normal red color.Pharynx clear.  NECK: Supple with no palpable mass and no adenopathy.  LUNGS: Sound clear with no rales rhonchi or wheezes.  HEART: Regular rhythm S1 and S2 without murmur.  BREAST: breasts appear normal, no suspicious masses, no skin or nipple changes or axillary nodes.  ABDOMEN: Soft and depressible, nontender with no palpable mass, no hepatomegaly.  EXTREMITIES: Well-developed well-nourished symmetrical with no dependent edema.  NEUROLOGICAL: Awake alert oriented, facial expression symmetrical, moving all extremities.  REVIEW OF DATA: I have reviewed the following data today:      Office Visit on 10/27/2019  Component Date Value  . Hemoglobin A1C 10/27/2019 5.6   . Average Blood Glucose (C*  10/27/2019 114      ASSESSMENT: Penny Acosta is a 49 y.o. 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    Patient was oriented again about the pathology results. Surgical alternatives were discussed with patient including partial vs total mastectomy. Surgical technique and post operative care was discussed with patient. Risk of surgery was discussed with patient including but not limited to: wound infection, seroma, hematoma, brachial plexopathy, mondor's disease (thrombosis of small veins of breast), chronic wound pain, breast lymphedema, altered sensation to the nipple and cosmesis among others.   Malignant neoplasm of upper-outer quadrant of left female breast, unspecified estrogen receptor status (CMS-HCC) [C50.412]  PLAN: 1.  Left breast radiofrequency guided partial mastectomy with sentinel biopsy 2.  CBC, CMP 3.  Avoid aspirin 5 days before surgery 4.  Contact us if you have any concern  Patient  and her husband verbalized understanding, all questions were answered, and were agreeable with the plan outlined above.    Herbert Pun, MD

## 2020-01-10 NOTE — Progress Notes (Signed)
Patient here for initial oncology appointment, expresses complaints or concerns about diagnosis. No pain or any other complaints.

## 2020-01-10 NOTE — H&P (View-Only) (Signed)
PATIENT PROFILE: Penny Acosta is a 49 y.o. female who presents to the Clinic for consultation at the request of Dr. Netty Starring for evaluation of left breast cancer.  PCP:  Dion Body, MD  HISTORY OF PRESENT ILLNESS: Penny Acosta reports she had her usual screening mammogram.  Screening mammogram shows calcification of the left breast.  These led to diagnostic mammogram and ultrasound.  Continued fixation were confirmed.  Core biopsy of the calcification was done showing a carcinoma in situ we did area of concern of focus of micro invasion.  Family history of breast cancer: None Family history of other cancers: Mother with pancreatic cancer Menarche: 64 years old Menopause: 49 years old (using Mirena) Used OCP: Mirena Used estrogen and progesterone therapy: Mirena History of Radiation to the chest: None Number of pregnancies: 1 (twins) Age of first pregnancy: 31  PROBLEM LIST:        Problem List  Date Reviewed: 10/27/2019       Noted   Class 1 obesity due to excess calories without serious comorbidity with body mass index (BMI) of 34.0 to 34.9 in adult Unknown      GENERAL REVIEW OF SYSTEMS:   General ROS: negative for - chills, fatigue, fever, weight gain or weight loss Allergy and Immunology ROS: negative for - hives  Hematological and Lymphatic ROS: negative for - bleeding problems or bruising, negative for palpable nodes Endocrine ROS: negative for - heat or cold intolerance, hair changes Respiratory ROS: negative for - cough, shortness of breath or wheezing Cardiovascular ROS: no chest pain or palpitations GI ROS: negative for nausea, vomiting, abdominal pain, diarrhea, constipation Musculoskeletal ROS: negative for - joint swelling or muscle pain Neurological ROS: negative for - confusion, syncope.  Positive for headache Dermatological ROS: negative for pruritus and rash Psychiatric: negative for depression, difficulty sleeping and memory loss.  Positive  for anxiety  MEDICATIONS: Current Medications        Current Outpatient Medications  Medication Sig Dispense Refill  . benzonatate (TESSALON) 200 MG capsule Take 1 capsule (200 mg total) by mouth 3 (three) times daily as needed for Cough 30 capsule 0  . cyclobenzaprine (FLEXERIL) 5 MG tablet Take by mouth as needed    . ibuprofen (MOTRIN) 400 MG tablet Take by mouth as needed    . levonorgestrel (MIRENA) 20 mcg/24 hr (5 years) IUD Insert 1 each into the uterus once       . lidocaine-prilocaine (EMLA) cream Apply topically once for 1 dose 5 g 0   No current facility-administered medications for this visit.      ALLERGIES: Patient has no known allergies.  PAST MEDICAL HISTORY:     Past Medical History:  Diagnosis Date  . Anxiety   . Cervicalgia   . Depression   . History of cancer 01/04/20  . History of postpartum depression    and anxiety  . Insomnia    stable  . Obesity     PAST SURGICAL HISTORY:      Past Surgical History:  Procedure Laterality Date  .  Bilateral breast reduction, 1994    . CESAREAN SECTION  2007  . COLONOSCOPY  03/25/2019   Normal colon/FHx CP-Father/Repeat 73yrs/TKT     FAMILY HISTORY:      Family History  Problem Relation Age of Onset  . Pancreatic cancer Mother   . Hypotension Mother   . Diabetes type II Father   . Heart failure Father   . Hypotension Father   . Parkinsonism Father   .  Colon polyps Father 43  . Prostate cancer Father   . Alcohol abuse Maternal Grandfather   . Coronary Artery Disease (Blocked arteries around heart) Maternal Grandmother      SOCIAL HISTORY: Social History          Socioeconomic History  . Marital status: Married    Spouse name: Not on file  . Number of children: Not on file  . Years of education: Not on file  . Highest education level: Not on file  Occupational History  . 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 Smoking  status: Never Smoker  . Smokeless tobacco: Never Used  Vaping Use  . Vaping Use: Never used  Substance and Sexual Activity  . Alcohol use: Yes    Comment: occasional wine  . Drug use: No  . Sexual activity: Yes    Partners: Male    Birth control/protection: I.U.D.  Other Topics Concern  . 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 Difficulty of Paying Living Expenses:   Food Insecurity:   . Worried About Charity fundraiser in the Last Year:   . Arboriculturist in the Last Year:   Transportation Needs:   . Film/video editor (Medical):   Marland Kitchen Lack of Transportation (Non-Medical):       PHYSICAL EXAM:    Vitals:   01/10/20 1452  BP: 98/67  Pulse: 69   Body mass index is 34.19 kg/m. Weight: 87.5 kg (193 lb)   GENERAL: Alert, active, oriented x3  HEENT: Pupils equal reactive to light. Extraocular movements are intact. Sclera clear. Palpebral conjunctiva normal red color.Pharynx clear.  NECK: Supple with no palpable mass and no adenopathy.  LUNGS: Sound clear with no rales rhonchi or wheezes.  HEART: Regular rhythm S1 and S2 without murmur.  BREAST: breasts appear normal, no suspicious masses, no skin or nipple changes or axillary nodes.  ABDOMEN: Soft and depressible, nontender with no palpable mass, no hepatomegaly.  EXTREMITIES: Well-developed well-nourished symmetrical with no dependent edema.  NEUROLOGICAL: Awake alert oriented, facial expression symmetrical, moving all extremities.  REVIEW OF DATA: I have reviewed the following data today:      Office Visit on 10/27/2019  Component Date Value  . Hemoglobin A1C 10/27/2019 5.6   . Average Blood Glucose (C*  10/27/2019 114      ASSESSMENT: Penny Acosta is a 49 y.o. 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    Patient was oriented again about the pathology results. Surgical alternatives were discussed with patient including partial vs total mastectomy. Surgical technique and post operative care was discussed with patient. Risk of surgery was discussed with patient including but not limited to: wound infection, seroma, hematoma, brachial plexopathy, mondor's disease (thrombosis of small veins of breast), chronic wound pain, breast lymphedema, altered sensation to the nipple and cosmesis among others.   Malignant neoplasm of upper-outer quadrant of left female breast, unspecified estrogen receptor status (CMS-HCC) [C50.412]  PLAN: 1.  Left breast radiofrequency guided partial mastectomy with sentinel biopsy 2.  CBC, CMP 3.  Avoid aspirin 5 days before surgery 4.  Contact us if you have any concern  Patient  and her husband verbalized understanding, all questions were answered, and were agreeable with the plan outlined above.    Herbert Pun, MD

## 2020-01-11 ENCOUNTER — Other Ambulatory Visit: Payer: Self-pay | Admitting: *Deleted

## 2020-01-11 ENCOUNTER — Other Ambulatory Visit: Payer: Self-pay | Admitting: General Surgery

## 2020-01-11 DIAGNOSIS — C50412 Malignant neoplasm of upper-outer quadrant of left female breast: Secondary | ICD-10-CM

## 2020-01-11 DIAGNOSIS — D0512 Intraductal carcinoma in situ of left breast: Secondary | ICD-10-CM

## 2020-01-13 ENCOUNTER — Telehealth: Payer: Self-pay | Admitting: *Deleted

## 2020-01-13 ENCOUNTER — Encounter: Payer: Self-pay | Admitting: Oncology

## 2020-01-13 DIAGNOSIS — Z7189 Other specified counseling: Secondary | ICD-10-CM | POA: Insufficient documentation

## 2020-01-13 NOTE — Progress Notes (Signed)
Hematology/Oncology Consult note [facility] Telephone:([phone] [phone]  Patient Care Team: Dion Body, MD as PCP - General (Family Medicine)   Name of the patient: Penny Acosta  [id number]  [birth year]    Reason for referral-new diagnosis of breast cancer   Referring physician-Dr. Netty Starring  Date of visit: 01/13/20   History of presenting illness- Patient is a 49 year old female who recently underwent a screening mammogram on 12/08/2019 which showed left breast calcifications.  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  ER testing before for final pathology.  Patient has a Mirena IUD in place but reports that her hormone levels were premenopausal.  She has not had any prior abnormal breast biopsies.  Family history significant for pancreatic cancer in her mother.  And prostate cancer and colon cancer in her father.  No family history of breast cancer.  She is G1, P1 L1.  She has not had any menstrual cycles after insertion of Mirena.  Patient is seeing Dr. Peyton Najjar later today to discuss surgery.  ECOG PS- 0  Pain scale- 0   Review of systems- Review of Systems  Constitutional: Negative for chills, fever, malaise/fatigue and weight loss.  HENT: Negative for congestion, ear discharge and nosebleeds.   Eyes: Negative for blurred vision.  Respiratory: Negative for cough, hemoptysis, sputum production, shortness of breath and wheezing.   Cardiovascular: Negative for chest pain, palpitations, orthopnea and claudication.  Gastrointestinal: Negative for abdominal pain, blood in stool, constipation, diarrhea, heartburn, melena, nausea and vomiting.  Genitourinary: Negative for dysuria, flank pain, frequency, hematuria and urgency.  Musculoskeletal: Negative for back pain, joint pain and myalgias.  Skin: Negative for rash.    Neurological: Negative for dizziness, tingling, focal weakness, seizures, weakness and headaches.  Endo/Heme/Allergies: Does not bruise/bleed easily.  Psychiatric/Behavioral: Negative for depression and suicidal ideas. The patient does not have insomnia.     No Known Allergies  There are no problems to display for this patient.    Past Medical History:  Diagnosis Date  . Anxiety   . IUD 02/2016   MIRENA INSERTED 11/2005, 10/2010. 02/2016     Past Surgical History:  Procedure Laterality Date  . BREAST BIOPSY Left 01/03/2020   [facility] UOQ, ribbon marker, path pending  . CESAREAN SECTION  2007   TWINS  . INTRAUTERINE DEVICE INSERTION  02/2016   mirena  . REDUCTION MAMMAPLASTY      Social History   Socioeconomic History  . Marital status: Married    Spouse name: Not on file  . Number of children: Not on file  . Years of education: Not on file  . Highest education level: Not on file  Occupational History  . Not on file  Tobacco Use  . Smoking status: Never Smoker  . Smokeless tobacco: Never Used  Substance and Sexual Activity  . Alcohol use: Yes    Alcohol/week: 0.0 standard drinks    Comment: once a month  . Drug use: No  . Sexual activity: Yes    Birth control/protection: I.U.D.    Comment: Mirena Inserted 02/2016 -1st intercourse 49 yo-More than 5 partners  Other Topics Concern  . Not on file  Social History Narrative  . Not on file   Social Determinants of Health   Financial Resource Strain:   . Difficulty of Paying Living Expenses:   Food Insecurity:   . Worried About Charity fundraiser in  the Last Year:   . Little Silver in the Last Year:   Transportation Needs:   . Film/video editor (Medical):   Marland Kitchen Lack of Transportation (Non-Medical):   Physical Activity:   . Days of Exercise per Week:   . Minutes of Exercise per Session:   Stress:   . Feeling of Stress :   Social Connections:   . Frequency of Communication with Friends and Family:   .  Frequency of Social Gatherings with Friends and Family:   . Attends Religious Services:   . Active Member of Clubs or Organizations:   . Attends Archivist Meetings:   Marland Kitchen Marital Status:   Intimate Partner Violence:   . Fear of Current or Ex-Partner:   . Emotionally Abused:   Marland Kitchen Physically Abused:   . Sexually Abused:      Family History  Problem Relation Age of Onset  . Diabetes Father   . Cancer Father        PROSTATE/COLON  . Parkinsonism Father   . Heart failure Father   . Turner syndrome Sister   . Cancer Mother 53       pancreatic capsulated, found when took gallbaldder out  . Breast cancer Neg Hx      Current Outpatient Medications:  .  levonorgestrel (MIRENA) 20 MCG/24HR IUD, 1 each by Intrauterine route once. , Disp: , Rfl:  .  Multiple Vitamin (MULTIVITAMIN WITH MINERALS) TABS tablet, Take 1 tablet by mouth daily., Disp: , Rfl:    Physical exam:  Vitals:   01/10/20 1330  BP: (!) 107/58  Pulse: 72  Resp: 16  Temp: 97.8 F (36.6 C)  TempSrc: Tympanic  SpO2: 100%  Weight: 193 lb 1.6 oz (87.6 kg)   Physical Exam Constitutional:      General: She is not in acute distress. HENT:     Head: Normocephalic and atraumatic.  Eyes:     Pupils: Pupils are equal, round, and reactive to light.  Cardiovascular:     Rate and Rhythm: Normal rate and regular rhythm.     Heart sounds: Normal heart sounds.  Pulmonary:     Effort: Pulmonary effort is normal.     Breath sounds: Normal breath sounds.  Abdominal:     General: Bowel sounds are normal.     Palpations: Abdomen is soft.  Musculoskeletal:     Cervical back: Normal range of motion.  Skin:    General: Skin is warm and dry.  Neurological:     Mental Status: She is alert and oriented to person, place, and time.     Breast exam performed in sitting and lying down position.  No palpable bilateral axillary adenopathy.  No palpable bilateral breast masses.  Induration noted at the site of recent  biopsy.   CMP Latest Ref Rng & Units 03/31/2019  Glucose 65 - 99 mg/dL 118(H)  BUN 7 - 25 mg/dL 22  Creatinine 0.50 - 1.05 mg/dL 0.85  Sodium 135 - 146 mmol/L 139  Potassium 3.5 - 5.3 mmol/L 3.6  Chloride 98 - 110 mmol/L 108  CO2 20 - 32 mmol/L 19(L)  Calcium 8.6 - 10.4 mg/dL 9.1  Total Protein 6.1 - 8.1 g/dL 6.7  Total Bilirubin 0.2 - 1.2 mg/dL 0.3  Alkaline Phos 33 - 115 U/L -  AST 10 - 35 U/L 19  ALT 6 - 29 U/L 18   CBC Latest Ref Rng & Units 03/31/2019  WBC 3.8 - 10.8 Thousand/uL 6.3  Hemoglobin 11.7 - 15.5 g/dL 12.9  Hematocrit 35.0 - 45.0 % 39.3  Platelets 140 - 400 Thousand/uL 263    No images are attached to the encounter.  MM Digital Diagnostic Unilat L  Result Date: 12/23/2019 CLINICAL DATA:  Left breast calcifications on a recent screening mammogram. EXAM: DIGITAL DIAGNOSTIC UNILATERAL LEFT MAMMOGRAM WITH CAD AND TOMO COMPARISON:  Previous exam(s). ACR Breast Density Category b: There are scattered areas of fibroglandular density. FINDINGS: 3D tomographic and 2D generated true lateral and 2D spot magnification views of the left breast were obtained. These demonstrate a 6 x 4 x 3 mm group of pleomorphic calcifications in the upper outer quadrant of the breast posteriorly. These vary in size shape and density. Mammographic images were processed with CAD. IMPRESSION: 6 mm group of indeterminate calcifications in the posterior aspect of the upper-outer quadrant of the left breast. RECOMMENDATION: 3D stereotactic guided core needle biopsy of the 6 mm group of indeterminate calcifications in the upper-outer quadrant of the left breast. This will be scheduled according to [facility] protocol. I have discussed the findings and recommendations with the patient. If applicable, a reminder letter will be sent to the patient regarding the next appointment. BI-RADS CATEGORY  4: Suspicious. Electronically Signed   By: Claudie Revering M.D.   On: 12/23/2019 15:34   MM CLIP PLACEMENT LEFT  Result Date:  01/03/2020 CLINICAL DATA:  Evaluate post biopsy marker clip placement following stereotactic core needle biopsy of left breast calcifications. EXAM: DIAGNOSTIC LEFT MAMMOGRAM POST STEREOTACTIC BIOPSY COMPARISON:  Prior exams FINDINGS: Mammographic images were obtained following stereotactic guided biopsy of left breast calcifications. The biopsy marking clip is in expected position at the site of biopsy. IMPRESSION: Appropriate positioning of the ribbon shaped biopsy marking clip at the site of biopsy in the lateral left breast adjacent to 1 residual calcification. Final Assessment: Post Procedure Mammograms for Marker Placement Electronically Signed   By: Lajean Manes M.D.   On: 01/03/2020 08:32   MM LT BREAST BX W LOC DEV 1ST LESION IMAGE BX SPEC STEREO GUIDE  Addendum Date: 01/05/2020   ADDENDUM REPORT: 01/05/2020 12:23 ADDENDUM: PATHOLOGY revealed: A. BREAST CALCIFICATIONS, LEFT UPPER OUTER QUADRANT; STEREOTACTIC BIOPSY: - IN SITU CARCINOMA, INTERMEDIATE GRADE, WITH CALCIFICATION AND COMEDONECROSIS. - FOCUS WORRISOME FOR MICROINVASIVE CARCINOMA. Comment: In situ carcinoma is present in 6 of 6 blocks with the largest linear focus measuring 11 mm. A single focus worrisome for microinvasive carcinoma (measuring 0.2 mm) is present and disappears on deeper sections. 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 These lesions are best managed as DCIS. Immunohistochemistry for estrogen receptor is deferred to an excision specimen. Pathology results are CONCORDANT with imaging findings, per Dr. Lajean Manes. Pathology results and recommendations below were discussed with patient by telephone on 01/04/2020. Patient reported biopsy site doing well with slight tenderness at the site. Post biopsy care instructions were reviewed and questions were answered. Patient was instructed to call  [facility] if any concerns or questions arise related to the biopsy. Recommendation: Surgical referral. Request for surgical referral was relayed to [place] and Tanya Nones RN at [facility] by Electa Sniff RN on 01/04/2020. Addendum by Electa Sniff RN on 01/05/2020. Electronically Signed   By: Lajean Manes M.D.   On: 01/05/2020 12:23   Result Date: 01/05/2020 CLINICAL DATA:  Patient presents for stereotactic core needle  biopsy of left breast calcifications. EXAM: LEFT BREAST STEREOTACTIC CORE NEEDLE BIOPSY COMPARISON:  Previous exams. FINDINGS: The patient and I discussed the procedure of stereotactic-guided biopsy including benefits and alternatives. We discussed the high likelihood of a successful procedure. We discussed the risks of the procedure including infection, bleeding, tissue injury, clip migration, and inadequate sampling. Informed written consent was given. The usual time out protocol was performed immediately prior to the procedure. Using sterile technique and 1% Lidocaine as local anesthetic, under stereotactic guidance, a 9 gauge vacuum assisted device was used to perform core needle biopsy of calcifications in the upper outer quadrant of the left breast using a lateral approach. Specimen radiograph was performed showing multiple calcifications for which biopsy was performed. Specimens with calcifications are identified for pathology. Lesion quadrant: Upper outer quadrant At the conclusion of the procedure, ribbon shaped tissue marker clip was deployed into the biopsy cavity. Follow-up 2-view mammogram was performed and dictated separately. IMPRESSION: Stereotactic-guided biopsy of left breast calcifications. No apparent complications. Electronically Signed: By: Lajean Manes M.D. On: 01/03/2020 08:26    Assessment and plan- Patient is a 49 y.o. 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  At this time I would recommend upfront lumpectomy with sentinel lymph node biopsy especially given the focus of microinvasion on core biopsy.  ER/PR and HER-2 status will be done on final pathology specimen.  I will see her after surgery to discuss final pathology and further management.  Patient will likely not need adjuvant chemotherapy.  if she is ER positive she would benefit from adjuvant hormone therapy for 5 years.  I will plan to recheck her hormone levels at her next visit.  I do recommend that she should get the Mirena taken out at some point given the small risk of estrogen exposure associated with it.  Patient will also see radiation oncology the same day she sees me after surgery.  Treatment will be given with a curative intent.  Patient and her husband had multiple insightful questions and they were all answered to her satisfaction.  We will also reach out to genetics to see if she would qualify for genetic counseling given her personal history of breast cancer in family history of pancreatic cancer in her mother and prostate cancer in her father.   Thank you for this kind referral and the opportunity to participate in the care of this patient   Visit Diagnosis 1. Ductal carcinoma in situ (DCIS) of left breast   2. Goals of care, counseling/discussion     Dr. Randa Evens, MD, MPH [facility] [phone] 01/13/2020 4:21 PM

## 2020-01-13 NOTE — Telephone Encounter (Signed)
Patient has been diagnosed with breast cancer, has Mirena IUD and asked if okay to have removed at annual exam in July. I called and spoke with the patient and informed her yes this is fine, or if she wants removed sooner she can schedule sooner visit just for removal of IUD only. Patient said the oncologist did not mention to her the need to have removed ASAP. She will follow up with oncology if IUD needs to be removed sooner she will call and schedule office visit.

## 2020-01-16 ENCOUNTER — Ambulatory Visit
Admission: RE | Admit: 2020-01-16 | Discharge: 2020-01-16 | Disposition: A | Discharge status: Home / Self Care | Payer: BC Managed Care – PPO | Source: Ambulatory Visit | Attending: General Surgery | Admitting: General Surgery

## 2020-01-16 ENCOUNTER — Encounter
Admission: RE | Admit: 2020-01-16 | Discharge: 2020-01-16 | Disposition: A | Discharge status: Home / Self Care | Payer: BC Managed Care – PPO | Source: Ambulatory Visit | Attending: General Surgery | Admitting: General Surgery

## 2020-01-16 ENCOUNTER — Other Ambulatory Visit: Payer: Self-pay

## 2020-01-16 DIAGNOSIS — C50412 Malignant neoplasm of upper-outer quadrant of left female breast: Secondary | ICD-10-CM | POA: Diagnosis not present

## 2020-01-16 HISTORY — DX: Malignant (primary) neoplasm, unspecified: C80.1

## 2020-01-16 HISTORY — DX: Nausea with vomiting, unspecified: R11.2

## 2020-01-16 HISTORY — DX: Gastro-esophageal reflux disease without esophagitis: K21.9

## 2020-01-16 HISTORY — DX: Other complications of anesthesia, initial encounter: T88.59XA

## 2020-01-16 HISTORY — DX: Other specified postprocedural states: Z98.890

## 2020-01-16 NOTE — Patient Instructions (Signed)
Your procedure is scheduled on: 01-20-20 FRIDAY Report to [facility] (2ND DESK ON RIGHT) @ 7:30 AM  Remember: Instructions that are not followed completely may result in serious medical risk, up to and including death, or upon the discretion of your surgeon and anesthesiologist your surgery may need to be rescheduled.    _x___ 1. Do not eat food after midnight the night before your procedure. NO GUM OR CANDY AFTER MIDNIGHT. You may drink clear liquids up to 2 hours before you are scheduled to arrive at the hospital for your procedure.  Do not drink clear liquids within 2 hours of your scheduled arrival to the hospital.  Clear liquids include  --Water or Apple juice without pulp  --Gatorade  --Black Coffee or Clear Tea (No milk, no creamers, do not add anything to the coffee or Tea-Sugar ok to add)   ____Ensure clear carbohydrate drink on the way to the hospital for bariatric patients  ____Ensure clear carbohydrate drink 3 hours before surgery.    __x__ 2. No Alcohol for 24 hours before or after surgery.   __x__3. No Smoking or e-cigarettes for 24 prior to surgery.  Do not use any chewable tobacco products for at least 6 hour prior to surgery   ____  4. Bring all medications with you on the day of surgery if instructed.    __x__ 5. Notify your doctor if there is any change in your medical condition     (cold, fever, infections).    x___6. On the morning of surgery brush your teeth with toothpaste and water.  You may rinse your mouth with mouth wash if you wish.  Do not swallow any toothpaste or mouthwash.   Do not wear jewelry, make-up, hairpins, clips or nail polish.  Do not wear lotions, powders, or perfumes. You may wear deodorant.  Do not shave 48 hours prior to surgery. Men may shave face and neck.  Do not bring valuables to the hospital.    [facility] is not responsible for any belongings or valuables.               Contacts, dentures or bridgework may not be  worn into surgery.  Leave your suitcase in the car. After surgery it may be brought to your room.  For patients admitted to the hospital, discharge time is determined by your  treatment team.  _  Patients discharged the day of surgery will not be allowed to drive home.  You will need someone to drive you home and stay with you the night of your procedure.    Please read over the following fact sheets that you were given:   [facility] Preparing for Surgery  ____ Take anti-hypertensive listed below, cardiac, seizure, asthma, anti-reflux and psychiatric medicines. These include:  1. NONE  2.  3.  4.  5.  6.  ____Fleets enema or Magnesium Citrate as directed.   ____ Use CHG Soap or sage wipes as directed on instruction sheet   ____ Use inhalers on the day of surgery and bring to hospital day of surgery  ____ Stop Metformin and Janumet 2 days prior to surgery.    ____ Take 1/2 of usual insulin dose the night before surgery and none on the morning  surgery.   ____ Follow recommendations from Cardiologist, Pulmonologist or PCP regarding stopping Aspirin, Coumadin, Plavix ,Eliquis, Effient, or Pradaxa, and Pletal.  X____Stop Anti-inflammatories such as Advil, Aleve, Ibuprofen, Motrin, Naproxen, Naprosyn, Goodies powders or aspirin products  NOW-OK to take Roberts IF NEEDED   ____ Stop supplements until after surgery.     ____ Bring C-Pap to the hospital.

## 2020-01-18 ENCOUNTER — Other Ambulatory Visit: Payer: BC Managed Care – PPO

## 2020-01-19 MED ORDER — CEFAZOLIN SODIUM-DEXTROSE 2-4 GM/100ML-% IV SOLN
2.0000 g | INTRAVENOUS | Status: AC
  Administered 2020-01-20: 2 g via INTRAVENOUS

## 2020-01-20 ENCOUNTER — Ambulatory Visit
Admission: RE | Admit: 2020-01-20 | Discharge: 2020-01-20 | Disposition: A | Discharge status: Home / Self Care | Payer: BC Managed Care – PPO | Attending: General Surgery | Admitting: General Surgery

## 2020-01-20 ENCOUNTER — Ambulatory Visit
Admission: RE | Admit: 2020-01-20 | Discharge: 2020-01-20 | Disposition: A | Discharge status: Home / Self Care | Payer: BC Managed Care – PPO | Source: Ambulatory Visit | Attending: General Surgery | Admitting: General Surgery

## 2020-01-20 ENCOUNTER — Other Ambulatory Visit: Payer: Self-pay

## 2020-01-20 ENCOUNTER — Encounter
Admission: RE | Disposition: A | Discharge status: Home / Self Care | Payer: Self-pay | Source: Home / Self Care | Attending: General Surgery

## 2020-01-20 ENCOUNTER — Ambulatory Visit: Payer: BC Managed Care – PPO | Admitting: Registered Nurse

## 2020-01-20 ENCOUNTER — Encounter: Payer: Self-pay | Admitting: General Surgery

## 2020-01-20 DIAGNOSIS — D0512 Intraductal carcinoma in situ of left breast: Secondary | ICD-10-CM | POA: Insufficient documentation

## 2020-01-20 DIAGNOSIS — Z6832 Body mass index (BMI) 32.0-32.9, adult: Secondary | ICD-10-CM | POA: Insufficient documentation

## 2020-01-20 DIAGNOSIS — Z79899 Other long term (current) drug therapy: Secondary | ICD-10-CM | POA: Diagnosis not present

## 2020-01-20 DIAGNOSIS — E669 Obesity, unspecified: Secondary | ICD-10-CM | POA: Insufficient documentation

## 2020-01-20 DIAGNOSIS — C50412 Malignant neoplasm of upper-outer quadrant of left female breast: Secondary | ICD-10-CM | POA: Diagnosis present

## 2020-01-20 DIAGNOSIS — Z820 Family history of epilepsy and other diseases of the nervous system: Secondary | ICD-10-CM | POA: Insufficient documentation

## 2020-01-20 DIAGNOSIS — Z800 Family history of malignant neoplasm of digestive organs: Secondary | ICD-10-CM | POA: Insufficient documentation

## 2020-01-20 DIAGNOSIS — Z791 Long term (current) use of non-steroidal anti-inflammatories (NSAID): Secondary | ICD-10-CM | POA: Insufficient documentation

## 2020-01-20 DIAGNOSIS — Z811 Family history of alcohol abuse and dependence: Secondary | ICD-10-CM | POA: Diagnosis not present

## 2020-01-20 DIAGNOSIS — Z8249 Family history of ischemic heart disease and other diseases of the circulatory system: Secondary | ICD-10-CM | POA: Diagnosis not present

## 2020-01-20 DIAGNOSIS — Z833 Family history of diabetes mellitus: Secondary | ICD-10-CM | POA: Diagnosis not present

## 2020-01-20 DIAGNOSIS — Z8371 Family history of colonic polyps: Secondary | ICD-10-CM | POA: Insufficient documentation

## 2020-01-20 DIAGNOSIS — Z793 Long term (current) use of hormonal contraceptives: Secondary | ICD-10-CM | POA: Diagnosis not present

## 2020-01-20 DIAGNOSIS — K219 Gastro-esophageal reflux disease without esophagitis: Secondary | ICD-10-CM | POA: Diagnosis not present

## 2020-01-20 DIAGNOSIS — F419 Anxiety disorder, unspecified: Secondary | ICD-10-CM | POA: Diagnosis not present

## 2020-01-20 HISTORY — PX: PART MASTECTOMY,RADIO FREQUENCY LOCALIZER,AXILLARY SENTINEL NODE BIOPSY: SHX6901

## 2020-01-20 LAB — POCT PREGNANCY, URINE: Preg Test, Ur: NEGATIVE

## 2020-01-20 SURGERY — PART MASTECTOMY,RADIO FREQUENCY LOCALIZER,AXILLARY SENTINEL NODE BIOPSY
Anesthesia: General | Site: Breast | Laterality: Left

## 2020-01-20 MED ORDER — LIDOCAINE HCL (PF) 2 % IJ SOLN
INTRAMUSCULAR | Status: AC
  Filled 2020-01-20: qty 5

## 2020-01-20 MED ORDER — SODIUM CHLORIDE 0.9 % IV SOLN
INTRAVENOUS | Status: DC | PRN
  Administered 2020-01-20: 30 ug/min via INTRAVENOUS

## 2020-01-20 MED ORDER — FAMOTIDINE 20 MG PO TABS: 20.0000 mg | ORAL_TABLET | Freq: Once | ORAL | Status: AC

## 2020-01-20 MED ORDER — PHENYLEPHRINE HCL (PRESSORS) 10 MG/ML IV SOLN
INTRAVENOUS | Status: DC | PRN
  Administered 2020-01-20: 50 ug via INTRAVENOUS
  Administered 2020-01-20: 150 ug via INTRAVENOUS
  Administered 2020-01-20: 200 ug via INTRAVENOUS
  Administered 2020-01-20: 50 ug via INTRAVENOUS
  Administered 2020-01-20: 100 ug via INTRAVENOUS
  Administered 2020-01-20: 50 ug via INTRAVENOUS
  Administered 2020-01-20: 100 ug via INTRAVENOUS
  Administered 2020-01-20 (×2): 50 ug via INTRAVENOUS

## 2020-01-20 MED ORDER — FENTANYL CITRATE (PF) 100 MCG/2ML IJ SOLN
INTRAMUSCULAR | Status: AC
  Administered 2020-01-20: 25 ug via INTRAVENOUS
  Filled 2020-01-20: qty 2

## 2020-01-20 MED ORDER — PHENYLEPHRINE HCL (PRESSORS) 10 MG/ML IV SOLN
INTRAVENOUS | Status: AC
  Filled 2020-01-20: qty 1

## 2020-01-20 MED ORDER — FENTANYL CITRATE (PF) 100 MCG/2ML IJ SOLN
INTRAMUSCULAR | Status: AC
  Filled 2020-01-20: qty 2

## 2020-01-20 MED ORDER — MIDAZOLAM HCL 2 MG/2ML IJ SOLN
INTRAMUSCULAR | Status: DC | PRN
  Administered 2020-01-20: 2 mg via INTRAVENOUS

## 2020-01-20 MED ORDER — ONDANSETRON HCL 4 MG/2ML IJ SOLN
INTRAMUSCULAR | Status: DC | PRN
  Administered 2020-01-20: 4 mg via INTRAVENOUS

## 2020-01-20 MED ORDER — PROPOFOL 10 MG/ML IV BOLUS
INTRAVENOUS | Status: AC
  Filled 2020-01-20: qty 20

## 2020-01-20 MED ORDER — ONDANSETRON HCL 4 MG/2ML IJ SOLN
INTRAMUSCULAR | Status: AC
  Filled 2020-01-20: qty 2

## 2020-01-20 MED ORDER — HYDROCODONE-ACETAMINOPHEN 5-325 MG PO TABS
1.0000 | ORAL_TABLET | ORAL | Status: DC | PRN
  Administered 2020-01-20: 1 via ORAL

## 2020-01-20 MED ORDER — PROPOFOL 10 MG/ML IV BOLUS
INTRAVENOUS | Status: DC | PRN
  Administered 2020-01-20: 150 mg via INTRAVENOUS

## 2020-01-20 MED ORDER — HYDROCODONE-ACETAMINOPHEN 5-325 MG PO TABS: 1.0000 | ORAL_TABLET | ORAL | 0 refills | Status: AC | PRN

## 2020-01-20 MED ORDER — GLYCOPYRROLATE 0.2 MG/ML IJ SOLN
INTRAMUSCULAR | Status: DC | PRN
  Administered 2020-01-20: .2 mg via INTRAVENOUS

## 2020-01-20 MED ORDER — FAMOTIDINE 20 MG PO TABS
ORAL_TABLET | ORAL | Status: AC
  Administered 2020-01-20: 20 mg via ORAL
  Filled 2020-01-20: qty 1

## 2020-01-20 MED ORDER — DEXAMETHASONE SODIUM PHOSPHATE 10 MG/ML IJ SOLN
INTRAMUSCULAR | Status: DC | PRN
  Administered 2020-01-20: 10 mg via INTRAVENOUS

## 2020-01-20 MED ORDER — BUPIVACAINE-EPINEPHRINE (PF) 0.25% -1:200000 IJ SOLN
INTRAMUSCULAR | Status: AC
  Filled 2020-01-20: qty 30

## 2020-01-20 MED ORDER — BUPIVACAINE HCL (PF) 0.5 % IJ SOLN
INTRAMUSCULAR | Status: AC
  Filled 2020-01-20: qty 30

## 2020-01-20 MED ORDER — HYDROCODONE-ACETAMINOPHEN 5-325 MG PO TABS
ORAL_TABLET | ORAL | Status: AC
  Filled 2020-01-20: qty 1

## 2020-01-20 MED ORDER — PROPOFOL 500 MG/50ML IV EMUL
INTRAVENOUS | Status: DC | PRN
Start: 2020-01-20 — End: 2020-01-20
  Administered 2020-01-20: 25 ug/kg/min via INTRAVENOUS

## 2020-01-20 MED ORDER — DEXAMETHASONE SODIUM PHOSPHATE 10 MG/ML IJ SOLN
INTRAMUSCULAR | Status: AC
  Filled 2020-01-20: qty 1

## 2020-01-20 MED ORDER — MIDAZOLAM HCL 2 MG/2ML IJ SOLN
INTRAMUSCULAR | Status: AC
  Filled 2020-01-20: qty 2

## 2020-01-20 MED ORDER — SCOPOLAMINE 1 MG/3DAYS TD PT72
MEDICATED_PATCH | TRANSDERMAL | Status: AC
  Administered 2020-01-20: 1.5 mg via TRANSDERMAL
  Filled 2020-01-20: qty 1

## 2020-01-20 MED ORDER — ONDANSETRON HCL 4 MG/2ML IJ SOLN
4.0000 mg | Freq: Once | INTRAMUSCULAR | Status: AC | PRN
  Administered 2020-01-20: 4 mg via INTRAVENOUS

## 2020-01-20 MED ORDER — EPINEPHRINE PF 1 MG/ML IJ SOLN
INTRAMUSCULAR | Status: AC
  Filled 2020-01-20: qty 1

## 2020-01-20 MED ORDER — FENTANYL CITRATE (PF) 100 MCG/2ML IJ SOLN
25.0000 ug | INTRAMUSCULAR | Status: AC | PRN
  Administered 2020-01-20 (×6): 25 ug via INTRAVENOUS

## 2020-01-20 MED ORDER — BUPIVACAINE-EPINEPHRINE 0.25% -1:200000 IJ SOLN
INTRAMUSCULAR | Status: DC | PRN
  Administered 2020-01-20: 8 mL

## 2020-01-20 MED ORDER — SEVOFLURANE IN SOLN
RESPIRATORY_TRACT | Status: AC
  Filled 2020-01-20: qty 250

## 2020-01-20 MED ORDER — SODIUM CHLORIDE 0.9 % IV SOLN: INTRAVENOUS | Status: DC | PRN

## 2020-01-20 MED ORDER — LIDOCAINE HCL (CARDIAC) PF 100 MG/5ML IV SOSY
PREFILLED_SYRINGE | INTRAVENOUS | Status: DC | PRN
  Administered 2020-01-20: 100 mg via INTRAVENOUS

## 2020-01-20 MED ORDER — GLYCOPYRROLATE 0.2 MG/ML IJ SOLN
INTRAMUSCULAR | Status: AC
  Filled 2020-01-20: qty 1

## 2020-01-20 MED ORDER — TECHNETIUM TC 99M SULFUR COLLOID FILTERED
1.0090 | Freq: Once | INTRAVENOUS | Status: AC | PRN
  Administered 2020-01-20: 1.009 via INTRADERMAL

## 2020-01-20 MED ORDER — METHYLENE BLUE 0.5 % INJ SOLN
INTRAVENOUS | Status: AC
  Filled 2020-01-20: qty 10

## 2020-01-20 MED ORDER — CEFAZOLIN SODIUM-DEXTROSE 2-4 GM/100ML-% IV SOLN
INTRAVENOUS | Status: AC
  Filled 2020-01-20: qty 100

## 2020-01-20 MED ORDER — SCOPOLAMINE 1 MG/3DAYS TD PT72: 1.0000 | MEDICATED_PATCH | Freq: Once | TRANSDERMAL | Status: DC

## 2020-01-20 MED ORDER — LACTATED RINGERS IV SOLN: INTRAVENOUS | Status: DC

## 2020-01-20 MED ORDER — FENTANYL CITRATE (PF) 100 MCG/2ML IJ SOLN
INTRAMUSCULAR | Status: DC | PRN
  Administered 2020-01-20 (×2): 25 ug via INTRAVENOUS
  Administered 2020-01-20: 50 ug via INTRAVENOUS

## 2020-01-20 SURGICAL SUPPLY — 63 items
ADH SKN CLS APL DERMABOND .7 (GAUZE/BANDAGES/DRESSINGS) ×1
APL PRP STRL LF DISP 70% ISPRP (MISCELLANEOUS) ×2
BINDER BREAST LRG (GAUZE/BANDAGES/DRESSINGS) IMPLANT
BINDER BREAST MEDIUM (GAUZE/BANDAGES/DRESSINGS) IMPLANT
BINDER BREAST XLRG (GAUZE/BANDAGES/DRESSINGS) IMPLANT
BINDER BREAST XXLRG (GAUZE/BANDAGES/DRESSINGS) IMPLANT
BLADE SURG 15 STRL LF DISP TIS (BLADE) ×2 IMPLANT
BLADE SURG 15 STRL SS (BLADE) ×6
CANISTER SUCT 1200ML W/VALVE (MISCELLANEOUS) ×3 IMPLANT
CHLORAPREP W/TINT 26 (MISCELLANEOUS) ×5 IMPLANT
CNTNR SPEC 2.5X3XGRAD LEK (MISCELLANEOUS)
CONT SPEC 4OZ STER OR WHT (MISCELLANEOUS)
CONT SPEC 4OZ STRL OR WHT (MISCELLANEOUS)
CONTAINER SPEC 2.5X3XGRAD LEK (MISCELLANEOUS) IMPLANT
COVER WAND RF STERILE (DRAPES) ×3 IMPLANT
DERMABOND ADVANCED (GAUZE/BANDAGES/DRESSINGS) ×2
DERMABOND ADVANCED .7 DNX12 (GAUZE/BANDAGES/DRESSINGS) ×1 IMPLANT
DEVICE DUBIN SPECIMEN MAMMOGRA (MISCELLANEOUS) ×3 IMPLANT
DRAPE LAPAROTOMY TRNSV 106X77 (MISCELLANEOUS) ×3 IMPLANT
DRSG GAUZE FLUFF 36X18 (GAUZE/BANDAGES/DRESSINGS) IMPLANT
ELECT CAUTERY BLADE 6.4 (BLADE) ×3 IMPLANT
ELECT REM PT RETURN 9FT ADLT (ELECTROSURGICAL) ×3
ELECTRODE REM PT RTRN 9FT ADLT (ELECTROSURGICAL) ×1 IMPLANT
GLOVE BIO SURGEON STRL SZ 6.5 (GLOVE) ×2 IMPLANT
GLOVE BIO SURGEONS STRL SZ 6.5 (GLOVE) ×1
GLOVE BIOGEL PI IND STRL 6.5 (GLOVE) ×1 IMPLANT
GLOVE BIOGEL PI INDICATOR 6.5 (GLOVE) ×4
GLOVE SURG PROTEXIS BL SZ6.5 (GLOVE) ×3 IMPLANT
GLOVE SURG SYN 6.5 PF PI BL (GLOVE) IMPLANT
GOWN STRL REUS W/ TWL LRG LVL3 (GOWN DISPOSABLE) ×2 IMPLANT
GOWN STRL REUS W/TWL LRG LVL3 (GOWN DISPOSABLE) ×9
HANDLE YANKAUER SUCT BULB TIP (MISCELLANEOUS) ×2 IMPLANT
KIT MARKER MARGIN INK (KITS) ×2 IMPLANT
KIT TURNOVER KIT A (KITS) ×3 IMPLANT
LABEL OR SOLS (LABEL) ×3 IMPLANT
MARGIN MAP 10MM (MISCELLANEOUS) ×3 IMPLANT
MARKER MARGIN CORRECT CLIP (MARKER) IMPLANT
NDL HYPO 25X1 1.5 SAFETY (NEEDLE) ×1 IMPLANT
NDL SAFETY ECLIPSE 18X1.5 (NEEDLE) IMPLANT
NEEDLE HYPO 18GX1.5 SHARP (NEEDLE) ×6
NEEDLE HYPO 22GX1.5 SAFETY (NEEDLE) ×3 IMPLANT
NEEDLE HYPO 25X1 1.5 SAFETY (NEEDLE) ×3 IMPLANT
PACK BASIN MINOR (MISCELLANEOUS) ×3 IMPLANT
RETRACTOR RING XSMALL (MISCELLANEOUS) IMPLANT
RTRCTR WOUND ALEXIS 13CM XS SH (MISCELLANEOUS) ×3
SET LOCALIZER 20 PROBE US (MISCELLANEOUS) ×5 IMPLANT
SLEVE PROBE SENORX GAMMA FIND (MISCELLANEOUS) ×3 IMPLANT
SUT ETHILON 3-0 FS-10 30 BLK (SUTURE) ×3
SUT MNCRL 4-0 (SUTURE) ×6
SUT MNCRL 4-0 27XMFL (SUTURE) ×2
SUT SILK 2 0 SH (SUTURE) ×3 IMPLANT
SUT VIC AB 3-0 SH 27 (SUTURE) ×6
SUT VIC AB 3-0 SH 27X BRD (SUTURE) ×2 IMPLANT
SUT VIC AB 5-0 PS2 18 (SUTURE) ×2 IMPLANT
SUTURE EHLN 3-0 FS-10 30 BLK (SUTURE) ×1 IMPLANT
SUTURE MNCRL 4-0 27XMF (SUTURE) ×2 IMPLANT
SYR 10ML LL (SYRINGE) ×8 IMPLANT
SYR 30ML LL (SYRINGE) ×2 IMPLANT
SYR BULB IRRIG 60ML STRL (SYRINGE) ×3 IMPLANT
Sterilized using gamma radiation ×2 IMPLANT
TUBING CONNECTING 10 (TUBING) ×1 IMPLANT
TUBING CONNECTING 10' (TUBING) ×1
WATER STERILE IRR 1000ML POUR (IV SOLUTION) ×3 IMPLANT

## 2020-01-20 NOTE — Discharge Instructions (Signed)
  Diet: Resume home heart healthy regular diet.   Activity: Increase activity as tolerated. Llight activity and walking are encouraged. Do not drive or drink alcohol if taking narcotic pain medications.  Wound care: May shower with soapy water and pat dry (do not rub incisions), but no baths or submerging incision underwater until follow-up. (no swimming)   Medications: Resume all home medications. For mild to moderate pain: acetaminophen (Tylenol) or ibuprofen (if no kidney disease). Combining Tylenol with alcohol can substantially increase your risk of causing liver disease. Narcotic pain medications, if prescribed, can be used for severe pain, though may cause nausea, constipation, and drowsiness. Do not combine Tylenol and Norco within a 6 hour period as Norco contains Tylenol. If you do not need the narcotic pain medication, you do not need to fill the prescription.  Call office [phone]) at any time if any questions, worsening pain, fevers/chills, bleeding, drainage from incision site, or other concerns.   AMBULATORY SURGERY  DISCHARGE INSTRUCTIONS   1) The drugs that you were given will stay in your system until tomorrow so for the next 24 hours you should not:  A) Drive an automobile B) Make any legal decisions C) Drink any alcoholic beverage   2) You may resume regular meals tomorrow.  Today it is better to start with liquids and gradually work up to solid foods.  You may eat anything you prefer, but it is better to start with liquids, then soup and crackers, and gradually work up to solid foods.   3) Please notify your doctor immediately if you have any unusual bleeding, trouble breathing, redness and pain at the surgery site, drainage, fever, or pain not relieved by medication. 4)   5) Your post-operative visit with Dr.                                     is: Date:                        Time:    Please call to schedule your post-operative visit.  6) Additional  Instructions:

## 2020-01-20 NOTE — Anesthesia Procedure Notes (Signed)
Procedure Name: LMA Insertion Date/Time: 01/20/2020 9:26 AM Performed by: Rudean Hitt, CRNA Pre-anesthesia Checklist: Patient identified, Patient being monitored, Timeout performed, Emergency Drugs available and Suction available Patient Re-evaluated:Patient Re-evaluated prior to induction Oxygen Delivery Method: Circle system utilized Preoxygenation: Pre-oxygenation with 100% oxygen Induction Type: IV induction Ventilation: Mask ventilation without difficulty LMA: LMA inserted LMA Size: 3.5 Tube type: Oral Number of attempts: 1 Placement Confirmation: positive ETCO2 and breath sounds checked- equal and bilateral Tube secured with: Tape Dental Injury: Teeth and Oropharynx as per pre-operative assessment

## 2020-01-20 NOTE — Interval H&P Note (Signed)
History and Physical Interval Note:  01/20/2020 8:50 AM  Penny Acosta  has presented today for surgery, with the diagnosis of C50.412 Malignant neoplasm of upper-outer quadrant of lt female breast, unspecified estrogen receptor.  The various methods of treatment have been discussed with the patient and family. After consideration of risks, benefits and other options for treatment, the patient has consented to  Procedure(s): PARTIAL MASTECTOMY WITH Radio Frequency AND AXILLARY SENTINEL LYMPH NODE BX (Left) as a surgical intervention.  The patient's history has been reviewed, patient examined, no change in status, stable for surgery.  I have reviewed the patient's chart and labs.  Left breast marked in the pre procedure room. Questions were answered to the patient's satisfaction.     Herbert Pun

## 2020-01-20 NOTE — Transfer of Care (Signed)
Immediate Anesthesia Transfer of Care Note  Patient: Penny Acosta  Procedure(s) Performed: PARTIAL MASTECTOMY WITH Radio Frequency AND AXILLARY SENTINEL LYMPH NODE BX (Left Breast)  Patient Location: PACU  Anesthesia Type:General  Level of Consciousness: drowsy  Airway & Oxygen Therapy: Patient Spontanous Breathing and Patient connected to face mask oxygen  Post-op Assessment: Report given to RN and Post -op Vital signs reviewed and stable  Post vital signs: Reviewed and stable  Last Vitals:  Vitals Value Taken Time  BP 103/47 01/20/20 1055  Temp 36.7 C 01/20/20 1055  Pulse 73 01/20/20 1058  Resp 14 01/20/20 1058  SpO2 100 % 01/20/20 1058  Vitals shown include unvalidated device data.  Last Pain:  Vitals:   01/20/20 1055  TempSrc:   PainSc: 0-No pain         Complications: No apparent anesthesia complications

## 2020-01-20 NOTE — Addendum Note (Signed)
Addendum  created 01/20/20 1340 by Rudean Hitt, CRNA   Charge Capture section accepted

## 2020-01-20 NOTE — Anesthesia Preprocedure Evaluation (Signed)
Anesthesia Evaluation  Patient identified by MRN, date of birth, ID band Patient awake    Reviewed: Allergy & Precautions, NPO status , Patient's Chart, lab work & pertinent test results  History of Anesthesia Complications (+) PONV and history of anesthetic complications  Airway Mallampati: II       Dental   Pulmonary neg sleep apnea, neg COPD, Not current smoker,           Cardiovascular (-) hypertension(-) Past MI and (-) CHF (-) dysrhythmias (-) Valvular Problems/Murmurs     Neuro/Psych neg Seizures Anxiety    GI/Hepatic Neg liver ROS, GERD  Medicated and Controlled,  Endo/Other  neg diabetes  Renal/GU negative Renal ROS     Musculoskeletal   Abdominal   Peds  Hematology   Anesthesia Other Findings   Reproductive/Obstetrics                             Anesthesia Physical Anesthesia Plan  ASA: II  Anesthesia Plan: General   Post-op Pain Management:    Induction: Intravenous  PONV Risk Score and Plan: 4 or greater and Ondansetron, Dexamethasone, Scopolamine patch - Pre-op and Midazolam  Airway Management Planned: LMA  Additional Equipment:   Intra-op Plan:   Post-operative Plan:   Informed Consent: I have reviewed the patients History and Physical, chart, labs and discussed the procedure including the risks, benefits and alternatives for the proposed anesthesia with the patient or authorized representative who has indicated his/her understanding and acceptance.       Plan Discussed with:   Anesthesia Plan Comments:         Anesthesia Quick Evaluation

## 2020-01-20 NOTE — Anesthesia Postprocedure Evaluation (Signed)
Anesthesia Post Note  Patient: Penny Acosta  Procedure(s) Performed: PARTIAL MASTECTOMY WITH Radio Frequency AND AXILLARY SENTINEL LYMPH NODE BX (Left Breast)  Patient location during evaluation: PACU Anesthesia Type: General Level of consciousness: awake and alert Pain management: pain level controlled Vital Signs Assessment: post-procedure vital signs reviewed and stable Respiratory status: spontaneous breathing and respiratory function stable Cardiovascular status: stable Anesthetic complications: no     Last Vitals:  Vitals:   01/20/20 1136 01/20/20 1154  BP: 96/60 (!) 96/59  Pulse: 72 76  Resp: 13 16  Temp:  36.7 C  SpO2: 94% 98%    Last Pain:  Vitals:   01/20/20 1154  TempSrc:   PainSc: 3                  [name] K

## 2020-01-20 NOTE — Op Note (Signed)
Preoperative diagnosis: Left breast carcinoma.  Postoperative diagnosis: Left breast carcinoma.   Procedure: Left radiofrequency tag-localized partial mastectomy.                       Left Axillary Sentinel Lymph node biopsy  Anesthesia: GETA  Surgeon: Dr. Windell Moment  Wound Classification: Clean  Indications: Patient is a 49 y.o. 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   Findings: 1. Specimen mammography shows marker and tag on specimen 2. Pathology call refers gross examination of margins was 5 mm from closest margin (anterior) 3. No other palpable mass or lymph node identified.   Description of procedure: Preoperative radiofrequency tag localization was performed by radiology. In the nuclear medicine suite, the subareolar region was injected with Tc-99 sulfur colloid. Localization studies were reviewed. The patient was taken to the operating room and placed supine on the operating table, and after general anesthesia the left chest and axilla were prepped and draped in the usual sterile fashion. A time-out was completed verifying correct patient, procedure, site, positioning, and implant(s) and/or special equipment prior to beginning this procedure.  By comparing the localization studies and interrogation with Localizer device, the probable trajectory and location of the mass was visualized. A circumareolar skin incision was planned in such a way as to minimize the amount of dissection to reach the mass.  The skin incision was made. Flaps were raised and the location of the tag was confirmed with Localizer device confirmed. A 2-0 silk figure-of-eight stay suture was placed and used for retraction. Dissection was then taken down circumferentially, taking care to  include the entire localizing tag and a wide margin of grossly normal tissue. The specimen and entire localizing tag were removed. The specimen was oriented and sent to radiology with the localization studies. Confirmation was received that the entire target lesion had been resected. The wound was irrigated. Hemostasis was checked. The wound was closed with interrupted sutures of 3-0 Vicryl and a subcuticular suture of Monocryl 3-0. No attempt was made to close the dead space.   A hand-held gamma probe was used to identify the location of the hottest spot in the axilla. An incision was made around the caudal axillary hairline. Dissection was carried down until subdermal facias was advanced. The probe was placed and again, the point of maximal count was found. Dissection continue until nodule was identified. The probe was placed in contact with the node. The node was excised in its entirety.  Two additional hot spot was detected and the node was excised in similar fashion. No clinically abnormal nodes were palpated. The procedure was terminated. Hemostasis was achieved and the wound closed in layers with deep interrupted 3-0 Vicryl and skin was closed with subcuticular suture of Monocryl 3-0.  The patient tolerated the procedure well and was taken to the postanesthesia care unit in stable condition.   Specimen: Left Breast mass                     Sentinel Lymph nodes  Complications: None  Estimated Blood Loss: 15 mL

## 2020-01-23 ENCOUNTER — Other Ambulatory Visit: Payer: Self-pay | Admitting: Oncology

## 2020-01-23 MED ORDER — LORAZEPAM 0.5 MG PO TABS: 0.5000 mg | ORAL_TABLET | Freq: Every day | ORAL | 0 refills | Status: DC

## 2020-01-26 LAB — SURGICAL PATHOLOGY

## 2020-02-09 ENCOUNTER — Ambulatory Visit
Admission: RE | Admit: 2020-02-09 | Discharge: 2020-02-09 | Disposition: A | Discharge status: Home / Self Care | Payer: BC Managed Care – PPO | Source: Ambulatory Visit | Attending: Radiation Oncology | Admitting: Radiation Oncology

## 2020-02-09 ENCOUNTER — Inpatient Hospital Stay: Payer: BC Managed Care – PPO

## 2020-02-09 ENCOUNTER — Inpatient Hospital Stay: Payer: BC Managed Care – PPO | Attending: Oncology | Admitting: Oncology

## 2020-02-09 ENCOUNTER — Encounter: Payer: Self-pay | Admitting: Oncology

## 2020-02-09 ENCOUNTER — Other Ambulatory Visit: Payer: Self-pay | Admitting: *Deleted

## 2020-02-09 ENCOUNTER — Other Ambulatory Visit: Payer: Self-pay

## 2020-02-09 VITALS — BP 105/72 | HR 80 | Temp 97.9°F | Resp 16 | Ht 65.0 in | Wt 196.6 lb

## 2020-02-09 DIAGNOSIS — D0502 Lobular carcinoma in situ of left breast: Secondary | ICD-10-CM | POA: Diagnosis present

## 2020-02-09 DIAGNOSIS — D0512 Intraductal carcinoma in situ of left breast: Secondary | ICD-10-CM

## 2020-02-09 DIAGNOSIS — Z7189 Other specified counseling: Secondary | ICD-10-CM

## 2020-02-09 DIAGNOSIS — C50412 Malignant neoplasm of upper-outer quadrant of left female breast: Secondary | ICD-10-CM

## 2020-02-09 DIAGNOSIS — Z800 Family history of malignant neoplasm of digestive organs: Secondary | ICD-10-CM | POA: Insufficient documentation

## 2020-02-09 DIAGNOSIS — Z8042 Family history of malignant neoplasm of prostate: Secondary | ICD-10-CM | POA: Insufficient documentation

## 2020-02-09 DIAGNOSIS — Z170 Estrogen receptor positive status [ER+]: Secondary | ICD-10-CM

## 2020-02-09 DIAGNOSIS — Z975 Presence of (intrauterine) contraceptive device: Secondary | ICD-10-CM | POA: Diagnosis not present

## 2020-02-09 NOTE — Progress Notes (Signed)
Left axillary still sore and it is swollen

## 2020-02-09 NOTE — Consult Note (Signed)
NEW PATIENT EVALUATION  Name: Penny Acosta  MRN: [id number]  Date:   02/09/2020     DOB: 12-27-1970   This 49 y.o. 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  REFERRING PHYSICIAN: Dion Body, MD  CHIEF COMPLAINT: No chief complaint on file.   DIAGNOSIS: The encounter diagnosis was Malignant neoplasm of upper-outer quadrant of left breast in female, estrogen receptor positive ([place]).   PREVIOUS INVESTIGATIONS:  Mammograms and ultrasound reviewed MRI of breast ordered Pathology report reviewed Clinical notes reviewed  HPI: Patient is a 49 year old female who had screening mammogram in March 2021 showing left breast calcifications. This prompted biopsy in April showing breast calcifications of the left upper quadrant stereotactic biopsy showed in situ carcinoma intermediate grade with comedonecrosis. There was also a focus worrisome for microinvasion. 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 3 sentinel lymph node showed no evidence of metastatic disease. Patient has done well postoperatively. She specifically denies breast tenderness cough or bone pain. She has been seen by Dr. Janese Banks.  PLANNED TREATMENT REGIMEN: Possible left breast whole breast radiation  PAST MEDICAL HISTORY:  has a past medical history of Anxiety, Breast cancer ([place]), Cancer ([place]), Complication of anesthesia, GERD (gastroesophageal reflux disease), IUD (02/2016), and PONV (postoperative nausea and vomiting).    PAST SURGICAL HISTORY:  Past Surgical History:  Procedure Laterality Date  . BREAST BIOPSY Left 01/03/2020   calcs UOQ, ribbon marker, path pending  . CESAREAN SECTION  2007   TWINS  . INTRAUTERINE DEVICE INSERTION  02/2016   mirena  . PART MASTECTOMY,RADIO FREQUENCY  LOCALIZER,AXILLARY SENTINEL NODE BIOPSY Left 01/20/2020   Procedure: PARTIAL MASTECTOMY WITH Radio Frequency AND AXILLARY SENTINEL LYMPH NODE BX;  Surgeon: Herbert Pun, MD;  Location: [facility] ORS;  Service: General;  Laterality: Left;  . REDUCTION MAMMAPLASTY      FAMILY HISTORY: family history includes Cancer in her father; Cancer (age of onset: 23) in her mother; Diabetes in her father; Heart failure in her father; Parkinsonism in her father; Turner syndrome in her sister.  SOCIAL HISTORY:  reports that she has never smoked. She has never used smokeless tobacco. She reports current alcohol use. She reports that she does not use drugs.  ALLERGIES: Patient has no known allergies.  MEDICATIONS:  Current Outpatient Medications  Medication Sig Dispense Refill  . cyclobenzaprine (FLEXERIL) 10 MG tablet Take 10 mg by mouth 3 (three) times daily as needed for muscle spasms.    Marland Kitchen ibuprofen (ADVIL) 200 MG tablet Take 400 mg by mouth every 6 (six) hours as needed.    Marland Kitchen levonorgestrel (MIRENA) 20 MCG/24HR IUD 1 each by Intrauterine route once.     Marland Kitchen LORazepam (ATIVAN) 0.5 MG tablet Take 1 tablet (0.5 mg total) by mouth at bedtime. 20 tablet 0  . Multiple Vitamin (MULTIVITAMIN WITH MINERALS) TABS tablet Take 1 tablet by mouth daily.     No current facility-administered medications for this encounter.    ECOG PERFORMANCE STATUS:  0 - Asymptomatic  REVIEW OF SYSTEMS: Patient denies any weight loss, fatigue, weakness, fever, chills or night sweats. Patient denies any loss of vision, blurred vision. Patient denies any ringing  of the ears or hearing loss. No irregular heartbeat. Patient denies heart murmur or history of fainting. Patient denies any  chest pain or pain radiating to her upper extremities. Patient denies any shortness of breath, difficulty breathing at night, cough or hemoptysis. Patient denies any swelling in the lower legs. Patient denies any nausea vomiting, vomiting of blood, or  coffee ground material in the vomitus. Patient denies any stomach pain. Patient states has had normal bowel movements no significant constipation or diarrhea. Patient denies any dysuria, hematuria or significant nocturia. Patient denies any problems walking, swelling in the joints or loss of balance. Patient denies any skin changes, loss of hair or loss of weight. Patient denies any excessive worrying or anxiety or significant depression. Patient denies any problems with insomnia. Patient denies excessive thirst, polyuria, polydipsia. Patient denies any swollen glands, patient denies easy bruising or easy bleeding. Patient denies any recent infections, allergies or URI. Patient "s visual fields have not changed significantly in recent time.   PHYSICAL EXAM: There were no vitals taken for this visit. Left breast the upper outer quadrant has a wide local excision site which is healing well. No dominant mass or nodularity is noted in either breast in 2 positions examined. No axillary or supraclavicular adenopathy is identified. Well-developed well-nourished patient in NAD. HEENT reveals PERLA, EOMI, discs not visualized.  Oral cavity is clear. No oral mucosal lesions are identified. Neck is clear without evidence of cervical or supraclavicular adenopathy. Lungs are clear to A&P. Cardiac examination is essentially unremarkable with regular rate and rhythm without murmur rub or thrill. Abdomen is benign with no organomegaly or masses noted. Motor sensory and DTR levels are equal and symmetric in the upper and lower extremities. Cranial nerves II through XII are grossly intact. Proprioception is intact. No peripheral adenopathy or edema is identified. No motor or sensory levels are noted. Crude visual fields are within normal range.  LABORATORY DATA: Pathology report reviewed    RADIOLOGY RESULTS: Mammogram and ultrasound reviewed   IMPRESSION: Ductal carcinoma in situ mostly lobular carcinoma in situ of the  left breast status post wide local excision and sentinel node biopsy in 49 year old female  PLAN: At this time I ordered an MRI scan of her bilateral breasts. Would also like to review her pathology since initial report was ductal carcinoma in situ. If there was DCIS would offer whole breast radiation to her left breast possibly a hypofractionated fractionated regimen. I will review with pathology the original report. I will also discuss in follow-up after her MRI scans any potential areas of concern in her breasts. This will also act as a baseline study for follow-up in the future should any other abnormalities appear on mammogram. Patient and husband both seem to comprehend my recommendations well. MRI was scheduled follow-up appointment scheduled.  I would like to take this opportunity to thank you for allowing me to participate in the care of your patient.Noreene Filbert, MD

## 2020-02-10 ENCOUNTER — Encounter: Payer: Self-pay | Admitting: Oncology

## 2020-02-10 LAB — FSH/LH
FSH: 5.9 m[IU]/mL
LH: 12.7 m[IU]/mL

## 2020-02-10 LAB — ESTRADIOL: Estradiol: 75.8 pg/mL

## 2020-02-10 MED ORDER — TAMOXIFEN CITRATE 20 MG PO TABS
20.0000 mg | ORAL_TABLET | Freq: Every day | ORAL | 3 refills | Status: DC
Start: 2020-02-10 — End: 2020-06-16

## 2020-02-13 DIAGNOSIS — D0502 Lobular carcinoma in situ of left breast: Secondary | ICD-10-CM | POA: Insufficient documentation

## 2020-02-13 NOTE — Progress Notes (Signed)
Hematology/Oncology Consult note [facility]  Telephone:([phone] [phone]  Patient Care Team: Dion Body, MD as PCP - General (Family Medicine)   Name of the patient: Penny Acosta  [id number]  Jul 17, 1971   Date of visit: 02/13/20  Diagnosis- left breast LCIS  Chief complaint/ Reason for visit- discuss final pathology results and further management  Heme/Onc history:  Patient is a 49 year old female who recently underwent a screening mammogram on 12/08/2019 which showed left breast calcifications.  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  biopsy specimen showed mixed features of dcis and lcis  Patient has a Mirena IUD in place but reports that her hormone levels were premenopausal.  She has not had any prior abnormal breast biopsies.  Family history significant for pancreatic cancer in her mother.  And prostate cancer and colon cancer in her father.  No family history of breast cancer.  She is G1, P1 L1.  She has not had any menstrual cycles after insertion of Mirena.   Interval history- patient reports feeling sore at the site if slnb. Denies other complaints  ECOG PS- 0 Pain scale- 0   Review of systems- Review of Systems  Constitutional: Negative for chills, fever, malaise/fatigue and weight loss.  HENT: Negative for congestion, ear discharge and nosebleeds.   Eyes: Negative for blurred vision.  Respiratory: Negative for cough, hemoptysis, sputum production, shortness of breath and wheezing.   Cardiovascular: Negative for chest pain, palpitations, orthopnea and claudication.  Gastrointestinal: Negative for abdominal pain, blood in stool, constipation, diarrhea, heartburn, melena, nausea and vomiting.  Genitourinary: Negative for dysuria, flank pain, frequency, hematuria and urgency.  Musculoskeletal:  Negative for back pain, joint pain and myalgias.  Skin: Negative for rash.  Neurological: Negative for dizziness, tingling, focal weakness, seizures, weakness and headaches.  Endo/Heme/Allergies: Does not bruise/bleed easily.  Psychiatric/Behavioral: Negative for depression and suicidal ideas. The patient does not have insomnia.       No Known Allergies   Past Medical History:  Diagnosis Date  . Anxiety   . Breast cancer ([place])   . Cancer ([place])   . Complication of anesthesia   . GERD (gastroesophageal reflux disease)    occ  . IUD 02/2016   MIRENA INSERTED 11/2005, 10/2010. 02/2016  . PONV (postoperative nausea and vomiting)      Past Surgical History:  Procedure Laterality Date  . BREAST BIOPSY Left 01/03/2020   calcs UOQ, ribbon marker, path pending  . CESAREAN SECTION  2007   TWINS  . INTRAUTERINE DEVICE INSERTION  02/2016   mirena  . PART MASTECTOMY,RADIO FREQUENCY LOCALIZER,AXILLARY SENTINEL NODE BIOPSY Left 01/20/2020   Procedure: PARTIAL MASTECTOMY WITH Radio Frequency AND AXILLARY SENTINEL LYMPH NODE BX;  Surgeon: Herbert Pun, MD;  Location: [facility] ORS;  Service: General;  Laterality: Left;  . REDUCTION MAMMAPLASTY      Social History   Socioeconomic History  . Marital status: Married    Spouse name: Not on file  . Number of children: Not on file  . Years of education: Not on file  . Highest education level: Not on file  Occupational History  . Not on file  Tobacco Use  . Smoking status: Never Smoker  . Smokeless tobacco: Never Used  Substance and Sexual Activity  . Alcohol use: Yes    Alcohol/week: 0.0 standard drinks    Comment: rare  . Drug use:  No  . Sexual activity: Yes    Birth control/protection: I.U.D.    Comment: Mirena Inserted 02/2016 -1st intercourse 49 yo-More than 5 partners  Other Topics Concern  . Not on file  Social History Narrative  . Not on file   Social Determinants of Health   Financial Resource Strain:   . Difficulty  of Paying Living Expenses:   Food Insecurity:   . Worried About Charity fundraiser in the Last Year:   . Arboriculturist in the Last Year:   Transportation Needs:   . Film/video editor (Medical):   Marland Kitchen Lack of Transportation (Non-Medical):   Physical Activity:   . Days of Exercise per Week:   . Minutes of Exercise per Session:   Stress:   . Feeling of Stress :   Social Connections:   . Frequency of Communication with Friends and Family:   . Frequency of Social Gatherings with Friends and Family:   . Attends Religious Services:   . Active Member of Clubs or Organizations:   . Attends Archivist Meetings:   Marland Kitchen Marital Status:   Intimate Partner Violence:   . Fear of Current or Ex-Partner:   . Emotionally Abused:   Marland Kitchen Physically Abused:   . Sexually Abused:     Family History  Problem Relation Age of Onset  . Diabetes Father   . Cancer Father        PROSTATE/COLON  . Parkinsonism Father   . Heart failure Father   . Turner syndrome Sister   . Cancer Mother 77       pancreatic capsulated, found when took gallbaldder out  . Breast cancer Neg Hx      Current Outpatient Medications:  .  cyclobenzaprine (FLEXERIL) 10 MG tablet, Take 10 mg by mouth 3 (three) times daily as needed for muscle spasms., Disp: , Rfl:  .  ibuprofen (ADVIL) 200 MG tablet, Take 400 mg by mouth every 6 (six) hours as needed., Disp: , Rfl:  .  levonorgestrel (MIRENA) 20 MCG/24HR IUD, 1 each by Intrauterine route once. , Disp: , Rfl:  .  LORazepam (ATIVAN) 0.5 MG tablet, Take 1 tablet (0.5 mg total) by mouth at bedtime., Disp: 20 tablet, Rfl: 0 .  Multiple Vitamin (MULTIVITAMIN WITH MINERALS) TABS tablet, Take 1 tablet by mouth daily., Disp: , Rfl:  .  tamoxifen (NOLVADEX) 20 MG tablet, Take 1 tablet (20 mg total) by mouth daily., Disp: 30 tablet, Rfl: 3  Physical exam:  Vitals:   02/09/20 1034  BP: 105/72  Pulse: 80  Resp: 16  Temp: 97.9 F (36.6 C)  TempSrc: Tympanic  Weight: 196 lb  9.6 oz (89.2 kg)  Height: '5\' 5"'$  (1.651 m)   Physical Exam HENT:     Head: Normocephalic and atraumatic.  Eyes:     Pupils: Pupils are equal, round, and reactive to light.  Cardiovascular:     Rate and Rhythm: Normal rate and regular rhythm.     Heart sounds: Normal heart sounds.  Pulmonary:     Effort: Pulmonary effort is normal.     Breath sounds: Normal breath sounds.  Abdominal:     General: Bowel sounds are normal.     Palpations: Abdomen is soft.  Musculoskeletal:     Cervical back: Normal range of motion.  Skin:    General: Skin is warm and dry.  Neurological:     Mental Status: She is alert and oriented to person, place, and time.  CMP Latest Ref Rng & Units 03/31/2019  Glucose 65 - 99 mg/dL 118(H)  BUN 7 - 25 mg/dL 22  Creatinine 0.50 - 1.05 mg/dL 0.85  Sodium 135 - 146 mmol/L 139  Potassium 3.5 - 5.3 mmol/L 3.6  Chloride 98 - 110 mmol/L 108  CO2 20 - 32 mmol/L 19(L)  Calcium 8.6 - 10.4 mg/dL 9.1  Total Protein 6.1 - 8.1 g/dL 6.7  Total Bilirubin 0.2 - 1.2 mg/dL 0.3  Alkaline Phos 33 - 115 U/L -  AST 10 - 35 U/L 19  ALT 6 - 29 U/L 18   CBC Latest Ref Rng & Units 03/31/2019  WBC 3.8 - 10.8 Thousand/uL 6.3  Hemoglobin 11.7 - 15.5 g/dL 12.9  Hematocrit 35.0 - 45.0 % 39.3  Platelets 140 - 400 Thousand/uL 263    No images are attached to the encounter.  NM SENTINEL NODE INJECTION  Result Date: 01/20/2020 CLINICAL DATA:  Left breast cancer. EXAM: NUCLEAR MEDICINE BREAST LYMPHOSCINTIGRAPHY TECHNIQUE: Intradermal injection of radiopharmaceutical was performed at the 12 o'clock, 3 o'clock, 6 o'clock, and 9 o'clock positions around the left nipple. The patient was then sent to the operating room where the sentinel node(s) were identified and removed by the surgeon. RADIOPHARMACEUTICALS:  Total of 1 mCi Millipore-filtered Technetium-10msulfur colloid, injected in four aliquots of 0.25 mCi each. IMPRESSION: Uncomplicated intradermal injection of a total of 1 mCi  Technetium-949mulfur colloid for purposes of sentinel node identification. Electronically Signed   By: GlAletta Edouard.D.   On: 01/20/2020 09:44   MM Breast Surgical Specimen  Result Date: 01/20/2020 CLINICAL DATA:  Status post surgical excision of the left breast. EXAM: SPECIMEN RADIOGRAPH OF THE LEFT BREAST COMPARISON:  Previous exam(s). FINDINGS: Status post excision of the left breast. The radiofrequency device and biopsy marker clip are present, completely intact, and were marked for pathology. IMPRESSION: Specimen radiograph of the left breast. Electronically Signed   By: DiLillia Mountain.D.   On: 01/20/2020 10:15   MM LT RADIO FREQUENCY TAG LOC MAMMO GUIDE  Result Date: 01/16/2020 CLINICAL DATA:  Patient presents for radiofrequency identification tag localization prior to lumpectomy of the LEFT breast. Recent stereotactic guided core biopsy shows in situ carcinoma and possible microinvasion. EXAM: MAMMOGRAPHIC GUIDED RADIOFREQUENCY DEVICE LOCALIZATION OF THE LEFTBREAST COMPARISON:  Previous exam(s) FINDINGS: Patient presents for radiofrequency device localization prior to lumpectomy. I met with the patient and we discussed the procedure of radiofrequency device localization including benefits and alternatives. We discussed the high likelihood of a successful procedure. We discussed the risks of the procedure including infection, bleeding, tissue injury and further surgery. Informed, written consent was given. The usual time-out protocol was performed immediately prior to the procedure. Using mammographic guidance, sterile technique, 1% lidocaine as local anesthesia, a radiofrequency identification tag was used to localize the ribbon shaped clip in the LATERAL portion of the LEFT breast using a LATERAL to MEDIAL approach. The follow-up mammogram images confirm that the RF device is in the expected location and are marked for Dr. CiWindell MomentThe patient tolerated the procedure well and was released from  [facility]. LOCalizer number: [id number]: Radiofrequency device localization of the LEFT breast. No apparent complications. Electronically Signed   By: ElNolon Nations.D.   On: 01/16/2020 16:39     Assessment and plan- Patient is a [age].o. female with left breast LCIS florid variant here to discuss final pathology results and further management  Patient's initial biopsy specimen showed in situ carcinoma with  calcifications and comdeonecrosis with a focus worrisome for microinvasive carcinoma.  Biopsy specimen showed mixed features of DCIS and LCIS.  Patient then underwent a lumpectomy which showed LCIS florid variant.  Negative for invasive carcinoma. Negative margins -5 mm. Tumor was ER positive  I discussed pathology results with Dr. Reuel Derby and there was no definitive evidence of invasive carcinoma or microinvasion on biopsy as well as final pathology.  Lumpectomy with negative margins would be the standard of care for LCIS Florid variant.  Radiation therapy is not usually recommended for LCIS but her biopsy specimen showed features of mixed DCIS and LCIS which will clarify further at tumor board.  Patient is meeting Dr. Donella Stade today to discuss adjuvant radiation treatment.  Even if there is no concern for DCIS the fact that patient had LCIS increases her lifetime by 7-11 fold compared to women without LCIS with an absolute risk of 1 %/year.  She would therefore qualify for chemoprevention.  Patient states that her hormone levels were checked last year and she was premenopausal despite not having menstrual cycles for many years after insertion of Mirena.  I will repeat her hormone levels at this time and if she is premenopausal the only option for chemoprevention would be tamoxifen.  I have also asked the patient to meet with GYN and get her Mirena taken out.  In terms of chemoprevention tamoxifen needs to be taken for 5 years and neuropathy prevents 7 cases of invasive breast cancer  and 1000 women over 5 years.  Also discussed risks and benefits of tamoxifen including all but not limited to hot flashes, mood swings, risk of uterine cancer and DVT as well as cataracts.  I would like patient to take tamoxifen after radiation treatment if that is in the picture.  If radiation is not of the picture patient can start taking tamoxifen right away.  Patient understands and agrees to proceed as planned.  Further follow-up to be decided based on tumor board discussion   Total face to face encounter time for this patient visit was 40 min.  Time spent in reviewing outside records as well as discussing patients case with DR. Chrystal    Visit Diagnosis 1. Breast neoplasm, Tis (LCIS), left      Dr. Randa Evens, MD, MPH [facility] [phone] 02/13/2020 11:03 AM

## 2020-02-16 ENCOUNTER — Other Ambulatory Visit: Payer: Self-pay | Admitting: *Deleted

## 2020-02-16 ENCOUNTER — Other Ambulatory Visit: Payer: Self-pay

## 2020-02-16 MED ORDER — ALPRAZOLAM 0.5 MG PO TABS
0.5000 mg | ORAL_TABLET | Freq: Once | ORAL | 0 refills | Status: DC | PRN
Start: 2020-02-16 — End: 2020-03-02

## 2020-02-17 ENCOUNTER — Ambulatory Visit (INDEPENDENT_AMBULATORY_CARE_PROVIDER_SITE_OTHER): Payer: BC Managed Care – PPO | Admitting: Obstetrics and Gynecology

## 2020-02-17 ENCOUNTER — Encounter: Payer: Self-pay | Admitting: Obstetrics and Gynecology

## 2020-02-17 VITALS — BP 118/76

## 2020-02-17 DIAGNOSIS — Z3009 Encounter for other general counseling and advice on contraception: Secondary | ICD-10-CM | POA: Diagnosis not present

## 2020-02-17 DIAGNOSIS — Z30432 Encounter for removal of intrauterine contraceptive device: Secondary | ICD-10-CM | POA: Diagnosis not present

## 2020-02-17 NOTE — Progress Notes (Signed)
   Penny Acosta 1971-05-25 [id number]  SUBJECTIVE:  49 y.o. G2P2 female presents for IUD removal due to recent diagnosis of breast cancer.  She had a left lumpectomy which showed LCIS and question of invasion is currently being evaluated.  It was recommended to her by her oncologist to have her hormonal IUD removed.  Her Flowella level on 02/09/2020 was 5.9.  She has been amenorrheic with the Mirena IUD in place, this is her third IUD.  OBJECTIVE:  BP 118/76  The patient appears well, alert, oriented x 3, in no distress. PELVIC EXAM: VULVA: normal appearing vulva with no masses, tenderness or lesions, VAGINA: normal appearing vagina with normal color and discharge, no lesions, CERVIX: normal appearing cervix without discharge or lesions  Procedure note IUD removal Cervix was visualized and the IUD strings were grasped with a [place] forceps and the device was removed with gentle traction.  The IUD was removed in its entirety as demonstrated to patient and staff and discarded.  Chaperone: [name] present during the examination  ASSESSMENT  49 y.o. [id number] here for IUD removal, recent breast cancer diagnosis  PLAN:  Hormonal IUDs should not be used in the context of breast cancer diagnosis.  Therefore the IUD was removed today. We reviewed that her [facility] indicates that she is premenopausal.  She therefore should still be concerned about effective contraception and avoidance of hormonal contraception.  She will plan to use condoms for now.  Another option would be the ParaGard IUD.  She will let me know she would like to do when she follows up for her routine annual exam at the end of July.   Joseph Pierini MD 02/17/20

## 2020-02-28 ENCOUNTER — Other Ambulatory Visit: Payer: Self-pay

## 2020-02-28 ENCOUNTER — Ambulatory Visit
Admission: RE | Admit: 2020-02-28 | Discharge: 2020-02-28 | Disposition: A | Discharge status: Home / Self Care | Payer: BC Managed Care – PPO | Source: Ambulatory Visit | Attending: Radiation Oncology | Admitting: Radiation Oncology

## 2020-02-28 DIAGNOSIS — Z170 Estrogen receptor positive status [ER+]: Secondary | ICD-10-CM | POA: Insufficient documentation

## 2020-02-28 DIAGNOSIS — C50412 Malignant neoplasm of upper-outer quadrant of left female breast: Secondary | ICD-10-CM

## 2020-02-28 MED ORDER — GADOBUTROL 1 MMOL/ML IV SOLN
8.0000 mL | Freq: Once | INTRAVENOUS | Status: AC | PRN
  Administered 2020-02-28: 8 mL via INTRAVENOUS

## 2020-02-29 ENCOUNTER — Other Ambulatory Visit: Payer: Self-pay

## 2020-02-29 DIAGNOSIS — N63 Unspecified lump in unspecified breast: Secondary | ICD-10-CM

## 2020-02-29 NOTE — Progress Notes (Signed)
MRI results reported to physicians, and patient.  Recommendation for 2nd look left breast ultrasound, and right breast MRI guided biopsy.  Scheduled patient for Ultrasound on Friday 03/02/20 at [place].  MRI biopsy will be scheduled once [place] results received per Anderson Malta at [facility].

## 2020-03-02 ENCOUNTER — Ambulatory Visit
Admission: RE | Admit: 2020-03-02 | Discharge: 2020-03-02 | Disposition: A | Discharge status: Home / Self Care | Payer: BC Managed Care – PPO | Source: Ambulatory Visit | Attending: Radiation Oncology | Admitting: Radiation Oncology

## 2020-03-02 ENCOUNTER — Other Ambulatory Visit: Payer: Self-pay | Admitting: Radiation Oncology

## 2020-03-02 ENCOUNTER — Other Ambulatory Visit: Payer: Self-pay | Admitting: *Deleted

## 2020-03-02 DIAGNOSIS — N63 Unspecified lump in unspecified breast: Secondary | ICD-10-CM

## 2020-03-02 MED ORDER — ALPRAZOLAM 1 MG PO TABS
1.0000 mg | ORAL_TABLET | Freq: Once | ORAL | 0 refills | Status: DC | PRN
Start: 2020-03-02 — End: 2020-03-22

## 2020-03-07 ENCOUNTER — Ambulatory Visit: Payer: BC Managed Care – PPO | Admitting: Radiation Oncology

## 2020-03-12 ENCOUNTER — Other Ambulatory Visit: Payer: Self-pay

## 2020-03-12 ENCOUNTER — Ambulatory Visit
Admission: RE | Admit: 2020-03-12 | Discharge: 2020-03-12 | Disposition: A | Discharge status: Home / Self Care | Payer: BC Managed Care – PPO | Source: Ambulatory Visit | Attending: Radiation Oncology | Admitting: Radiation Oncology

## 2020-03-12 ENCOUNTER — Other Ambulatory Visit (HOSPITAL_COMMUNITY): Payer: Self-pay | Admitting: Diagnostic Radiology

## 2020-03-12 DIAGNOSIS — N63 Unspecified lump in unspecified breast: Secondary | ICD-10-CM

## 2020-03-12 MED ORDER — GADOBUTROL 1 MMOL/ML IV SOLN
8.0000 mL | Freq: Once | INTRAVENOUS | Status: AC | PRN
  Administered 2020-03-12: 8 mL via INTRAVENOUS

## 2020-03-20 ENCOUNTER — Inpatient Hospital Stay: Payer: BC Managed Care – PPO

## 2020-03-22 ENCOUNTER — Other Ambulatory Visit: Payer: Self-pay

## 2020-03-22 ENCOUNTER — Inpatient Hospital Stay: Payer: BC Managed Care – PPO | Attending: Oncology | Admitting: Oncology

## 2020-03-22 ENCOUNTER — Encounter: Payer: Self-pay | Admitting: Oncology

## 2020-03-22 ENCOUNTER — Ambulatory Visit
Admission: RE | Admit: 2020-03-22 | Discharge: 2020-03-22 | Disposition: A | Discharge status: Home / Self Care | Payer: BC Managed Care – PPO | Source: Ambulatory Visit | Attending: Radiation Oncology | Admitting: Radiation Oncology

## 2020-03-22 ENCOUNTER — Encounter: Payer: Self-pay | Admitting: Radiation Oncology

## 2020-03-22 VITALS — BP 107/68 | HR 74 | Temp 96.7°F | Resp 16 | Wt 201.1 lb

## 2020-03-22 VITALS — BP 107/68 | HR 74 | Temp 96.7°F | Resp 20 | Wt 201.0 lb

## 2020-03-22 DIAGNOSIS — Z9012 Acquired absence of left breast and nipple: Secondary | ICD-10-CM | POA: Insufficient documentation

## 2020-03-22 DIAGNOSIS — Z800 Family history of malignant neoplasm of digestive organs: Secondary | ICD-10-CM | POA: Insufficient documentation

## 2020-03-22 DIAGNOSIS — Z975 Presence of (intrauterine) contraceptive device: Secondary | ICD-10-CM | POA: Diagnosis not present

## 2020-03-22 DIAGNOSIS — Z8042 Family history of malignant neoplasm of prostate: Secondary | ICD-10-CM | POA: Insufficient documentation

## 2020-03-22 DIAGNOSIS — Z79899 Other long term (current) drug therapy: Secondary | ICD-10-CM | POA: Insufficient documentation

## 2020-03-22 DIAGNOSIS — D0502 Lobular carcinoma in situ of left breast: Secondary | ICD-10-CM | POA: Insufficient documentation

## 2020-03-22 DIAGNOSIS — C50412 Malignant neoplasm of upper-outer quadrant of left female breast: Secondary | ICD-10-CM

## 2020-03-22 DIAGNOSIS — Z833 Family history of diabetes mellitus: Secondary | ICD-10-CM | POA: Diagnosis not present

## 2020-03-22 DIAGNOSIS — Z170 Estrogen receptor positive status [ER+]: Secondary | ICD-10-CM | POA: Insufficient documentation

## 2020-03-22 DIAGNOSIS — Z79810 Long term (current) use of selective estrogen receptor modulators (SERMs): Secondary | ICD-10-CM | POA: Insufficient documentation

## 2020-03-22 DIAGNOSIS — D0512 Intraductal carcinoma in situ of left breast: Secondary | ICD-10-CM | POA: Insufficient documentation

## 2020-03-22 NOTE — Progress Notes (Signed)
Hematology/Oncology Consult note [facility]  Telephone:([phone] Fax:([phone]  Patient Care Team: Dion Body, MD as PCP - General (Family Medicine)   Name of the patient: Penny Acosta  [id number]  04/07/1971   Date of visit: 03/22/20  Diagnosis- left breast LCIS  Chief complaint/ Reason for visit- discuss mri findings and further management  Heme/Onc history: Patient is a 49 year old female who recently underwent a screening mammogram on 12/08/2019 which showed left breast calcifications. 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 biopsy specimen showed mixed features of dcis and lcis  Patient has a Mirena IUD in place but reports that her hormone levels were premenopausal. She has not had any prior abnormal breast biopsies. Family history significant for pancreatic cancer in her mother. And prostate cancer  in her father. No family history of breast cancer. She is G1, P1 L1. She has not had any menstrual cycles after insertion of Mirena.her Mirena has been taken out.   She had b/l breast MRI which showed lobular mass in the right breast and possible complex cyst in the left breast. Left breast cyst was a benign sebaceous cyst. Right breast biopsy showed  FIBROCYSTIC CHANGE WITH APOCRINE METAPLASIA, PSEUDOANGIOMATOUS STROMAL HYPERPLASIA  Interval history- she is doing well and denies any complaints at this time   ECOG PS- 0 Pain scale- 0   Review of systems- Review of Systems  Constitutional: Negative for chills, fever, malaise/fatigue and weight loss.  HENT: Negative for congestion, ear discharge and nosebleeds.   Eyes: Negative for blurred vision.  Respiratory: Negative for cough, hemoptysis, sputum production, shortness of breath and wheezing.   Cardiovascular: Negative for chest pain, palpitations,  orthopnea and claudication.  Gastrointestinal: Negative for abdominal pain, blood in stool, constipation, diarrhea, heartburn, melena, nausea and vomiting.  Genitourinary: Negative for dysuria, flank pain, frequency, hematuria and urgency.  Musculoskeletal: Negative for back pain, joint pain and myalgias.  Skin: Negative for rash.  Neurological: Negative for dizziness, tingling, focal weakness, seizures, weakness and headaches.  Endo/Heme/Allergies: Does not bruise/bleed easily.  Psychiatric/Behavioral: Negative for depression and suicidal ideas. The patient does not have insomnia.      No Known Allergies   Past Medical History:  Diagnosis Date  . Anxiety   . Breast cancer ([place])   . Cancer ([place])   . Complication of anesthesia   . GERD (gastroesophageal reflux disease)    occ  . IUD 02/2016   MIRENA INSERTED 11/2005, 10/2010. 02/2016  . PONV (postoperative nausea and vomiting)      Past Surgical History:  Procedure Laterality Date  . BREAST BIOPSY Left 01/03/2020   calcs UOQ, ribbon marker, path pending  . CESAREAN SECTION  2007   TWINS  . INTRAUTERINE DEVICE INSERTION  02/2016   mirena  . PART MASTECTOMY,RADIO FREQUENCY LOCALIZER,AXILLARY SENTINEL NODE BIOPSY Left 01/20/2020   Procedure: PARTIAL MASTECTOMY WITH Radio Frequency AND AXILLARY SENTINEL LYMPH NODE BX;  Surgeon: Herbert Pun, MD;  Location: [facility] ORS;  Service: General;  Laterality: Left;  . REDUCTION MAMMAPLASTY      Social History   Socioeconomic History  . Marital status: Married    Spouse name: Not on file  . Number of children: Not on file  . Years of education: Not on file  . Highest education level: Not on file  Occupational History  . Not on file  Tobacco Use  . Smoking  status: Never Smoker  . Smokeless tobacco: Never Used  Vaping Use  . Vaping Use: Never used  Substance and Sexual Activity  . Alcohol use: Yes    Alcohol/week: 0.0 standard drinks    Comment: rare  . Drug use: No  .  Sexual activity: Yes    Birth control/protection: I.U.D.    Comment: Mirena Inserted 02/2016 -1st intercourse 49 yo-More than 5 partners  Other Topics Concern  . Not on file  Social History Narrative  . Not on file   Social Determinants of Health   Financial Resource Strain:   . Difficulty of Paying Living Expenses:   Food Insecurity:   . Worried About Charity fundraiser in the Last Year:   . Arboriculturist in the Last Year:   Transportation Needs:   . Film/video editor (Medical):   Marland Kitchen Lack of Transportation (Non-Medical):   Physical Activity:   . Days of Exercise per Week:   . Minutes of Exercise per Session:   Stress:   . Feeling of Stress :   Social Connections:   . Frequency of Communication with Friends and Family:   . Frequency of Social Gatherings with Friends and Family:   . Attends Religious Services:   . Active Member of Clubs or Organizations:   . Attends Archivist Meetings:   Marland Kitchen Marital Status:   Intimate Partner Violence:   . Fear of Current or Ex-Partner:   . Emotionally Abused:   Marland Kitchen Physically Abused:   . Sexually Abused:     Family History  Problem Relation Age of Onset  . Diabetes Father   . Cancer Father        PROSTATE/COLON  . Parkinsonism Father   . Heart failure Father   . Turner syndrome Sister   . Cancer Mother 33       pancreatic capsulated, found when took gallbaldder out  . Breast cancer Neg Hx      Current Outpatient Medications:  .  cyclobenzaprine (FLEXERIL) 10 MG tablet, Take 10 mg by mouth 3 (three) times daily as needed for muscle spasms., Disp: , Rfl:  .  ibuprofen (ADVIL) 200 MG tablet, Take 400 mg by mouth every 6 (six) hours as needed., Disp: , Rfl:  .  LORazepam (ATIVAN) 0.5 MG tablet, Take 1 tablet (0.5 mg total) by mouth at bedtime., Disp: 20 tablet, Rfl: 0 .  Multiple Vitamin (MULTIVITAMIN WITH MINERALS) TABS tablet, Take 1 tablet by mouth daily., Disp: , Rfl:  .  tamoxifen (NOLVADEX) 20 MG tablet, Take 1  tablet (20 mg total) by mouth daily., Disp: 30 tablet, Rfl: 3  Physical exam:  Vitals:   03/22/20 0950  BP: 107/68  Pulse: 74  Resp: 20  Temp: (!) 96.7 F (35.9 C)  Weight: 201 lb (91.2 kg)   Physical Exam Pulmonary:     Effort: Pulmonary effort is normal.  Skin:    General: Skin is warm and dry.  Neurological:     Mental Status: She is alert and oriented to person, place, and time.      CMP Latest Ref Rng & Units 03/31/2019  Glucose 65 - 99 mg/dL 118(H)  BUN 7 - 25 mg/dL 22  Creatinine 0.50 - 1.05 mg/dL 0.85  Sodium 135 - 146 mmol/L 139  Potassium 3.5 - 5.3 mmol/L 3.6  Chloride 98 - 110 mmol/L 108  CO2 20 - 32 mmol/L 19(L)  Calcium 8.6 - 10.4 mg/dL 9.1  Total Protein 6.1 -  8.1 g/dL 6.7  Total Bilirubin 0.2 - 1.2 mg/dL 0.3  Alkaline Phos 33 - 115 U/L -  AST 10 - 35 U/L 19  ALT 6 - 29 U/L 18   CBC Latest Ref Rng & Units 03/31/2019  WBC 3.8 - 10.8 Thousand/uL 6.3  Hemoglobin 11.7 - 15.5 g/dL 41.0  Hematocrit 35 - 45 % 39.3  Platelets 140 - 400 Thousand/uL 263    No images are attached to the encounter.  MR BREAST BILATERAL W WO CONTRAST INC CAD  Result Date: 02/28/2020 CLINICAL DATA:  Patient with history left breast lumpectomy. Given that a small focus of invasive carcinoma was identified on the original biopsy however not identified on the surgical specimen, further evaluation was warranted. EXAM: BILATERAL BREAST MRI WITH AND WITHOUT CONTRAST TECHNIQUE: Multiplanar, multisequence MR images of both breasts were obtained prior to and following the intravenous administration of 8 ml of Gadavist Three-dimensional MR images were rendered by post-processing of the original MR data on an independent workstation. The three-dimensional MR images were interpreted, and findings are reported in the following complete MRI report for this study. Three dimensional images were evaluated at the independent DynaCad workstation COMPARISON:  Previous exam(s). FINDINGS: Breast composition:  c. Heterogeneous fibroglandular tissue. Background parenchymal enhancement: Moderate. Right breast: Within the anterior slightly superior right breast there is a 6 x 8 mm lobular enhancing mass (image 69; series 15). No additional suspicious areas of enhancement are identified within the right breast. Within the skin overlying the lower inner right breast (image 46; series 15) there is a small enhancing lesion. Left breast: Post lumpectomy changes demonstrated within the outer left breast. Within the medial aspect of the left breast posterior depth (image 33; series 4) there is a T2 bright mass with peripheral enhancement. Lymph nodes: No abnormal appearing lymph nodes. Ancillary findings:  None. IMPRESSION: 1. Indeterminate lobular enhancing mass within the anterior aspect of the right breast. 2. T2 bright mass within the medial aspect of the left breast with peripheral enhancement, potentially representing a complex cyst or cutaneous lesion/sebaceous cyst. 3. Small enhancing lesion within the skin overlying the medial aspect of the right breast. 4. Interval postlumpectomy changes left breast. No additional suspicious areas of enhancement identified within the left breast. RECOMMENDATION: 1. MRI guided core needle biopsy of the enhancing lobular mass within the central anterior right breast. 2. Second-look ultrasound/physical exam to assess the T2 bright enhancing mass within the medial aspect of the left breast and confirm either a sebaceous cyst or complicated cyst. Additionally, at this time, recommend assessment of the enhancing lesion within the skin overlying the medial aspect of the right breast, favored to represent sebaceous cyst. BI-RADS CATEGORY  4: Suspicious. Electronically Signed   By: Annia Belt M.D.   On: 02/28/2020 14:31   US BREAST LTD UNI LEFT INC AXILLA  Result Date: 03/02/2020 CLINICAL DATA:  MRI directed ultrasound of the left breast for a rim enhancing mass. EXAM: ULTRASOUND OF THE LEFT  BREAST COMPARISON:  Previous exam(s). FINDINGS: On physical exam, there is a tiny palpable lump in the medial left breast with a centrally located punctum. Physical exam of the medial right breast demonstrates 2 additional sebaceous cysts, one of which has recently ruptured. Targeted ultrasound is performed, showing an oval hypoechoic mass within and deep to the skin measuring 7 x 6 x 7 mm. A hypoechoic tract is extending to the skin surface. IMPRESSION: 1. The rim enhancing mass in the medial left breast on the MRI  corresponds with a benign sebaceous cyst. 2. Physical exam of the medial right breast demonstrates 2 additional sebaceous cysts, likely corresponding with the in enhancing small skin lesion on the patient's MRI. RECOMMENDATION: The patient should proceed with MRI guided core needle biopsy of the right breast as recommended in the MRI report from 02/28/2020. I have discussed the findings and recommendations with the patient. If applicable, a reminder letter will be sent to the patient regarding the next appointment. BI-RADS CATEGORY  2: Benign. Electronically Signed   By: Ammie Ferrier M.D.   On: 03/02/2020 12:12   MM CLIP PLACEMENT RIGHT  Result Date: 03/12/2020 CLINICAL DATA:  Confirmation of clip placement after MRI guided biopsy of an indeterminate 8 mm mass and non-mass enhancement in the UPPER RIGHT breast at ANTERIOR depth, near 12 o'clock location. EXAM: 2D AND TOMOSYNTHESIS DIAGNOSTIC RIGHT MAMMOGRAM POST MRI BIOPSY COMPARISON:  Previous exam(s). FINDINGS: Tomosynthesis and synthesized full field CC and mediolateral images were obtained following MRI guided biopsy of an indeterminate 8 mm mass at 9 mass enhancement in the UPPER RIGHT breast at ANTERIOR depth. The dumbbell-shaped tissue marking clip is appropriately position at the site of the biopsy in the UPPER breast at ANTERIOR depth, near 12 o'clock location. Expected post biopsy changes are present without evidence of hematoma.  IMPRESSION: Appropriate positioning of the dumbbell shaped tissue marking clip at the site of MRI biopsy in the UPPER RIGHT breast at ANTERIOR depth, near 12 o'clock location. Final Assessment: Post Procedure Mammograms for Marker Placement Electronically Signed   By: Evangeline Dakin M.D.   On: 03/12/2020 09:34   MR RT BREAST BX W LOC DEV 1ST LESION IMAGE BX SPEC MR GUIDE  Addendum Date: 03/13/2020   ADDENDUM REPORT: 03/13/2020 10:28 ADDENDUM: Pathology revealed FIBROCYSTIC CHANGE WITH APOCRINE METAPLASIA, PSEUDOANGIOMATOUS STROMAL HYPERPLASIA of the upper RIGHT breast at anterior depth. This was found to be concordant by Dr. Peggye Fothergill. Pathology results were discussed with the patient by telephone. The patient reported doing well after the biopsy with tenderness at the site. Post biopsy instructions and care were reviewed and questions were answered. The patient was encouraged to call [facility] for any additional concerns. The patient has a recent diagnosis of LEFT breast cancer and should follow her outlined treatment plan. The patient was instructed to return for bilateral diagnostic mammography in March, 2022, and she was informed a reminder notice would be sent regarding this appointment. Pathology results reported by Stacie Acres RN on 03/13/2020. Electronically Signed   By: Evangeline Dakin M.D.   On: 03/13/2020 10:28   Result Date: 03/13/2020 CLINICAL DATA:  49 year old who underwent malignant lumpectomy of the LEFT breast on 01/20/2020. She has an MRI-detected indeterminate 8 mm mass and non-mass enhancement in the UPPER RIGHT breast at ANTERIOR depth, near 12 o'clock location. EXAM: MRI GUIDED CORE NEEDLE BIOPSY OF THE RIGHT BREAST TECHNIQUE: Multiplanar, multisequence MR imaging of the RIGHT breast was performed both before and after administration of intravenous contrast. CONTRAST:  8 mL Gadavist (gadobutrol) IV. COMPARISON:  Previous exams. FINDINGS: I met with  the patient, and we discussed the procedure of MRI guided biopsy, including risks, benefits, and alternatives. Specifically, we discussed the risks of infection, bleeding, tissue injury, clip migration, and inadequate sampling. Informed, written consent was given. The usual time out protocol was performed immediately prior to the procedure. Using sterile technique with chlorhexidine as skin antisepsis, 1% lidocaine and 1% lidocaine with epinephrine as local anesthesia, using MRI  guidance, a 9 gauge Eviva vacuum assisted device was used perform biopsy of the mass and non-mass enhancement in the upper RIGHT breast at anterior depth using a lateral approach. Post biopsy images demonstrate the biopsy cavity At the conclusion of the procedure, a dumbbell-shaped tissue marker clip was deployed into the biopsy cavity. Follow-up 2-view mammogram was performed and dictated separately. IMPRESSION: MRI guided biopsy of an indeterminate 8 mm mass and non-mass enhancement in the UPPER RIGHT breast at ANTERIOR depth. No apparent complications. Electronically Signed: By: Evangeline Dakin M.D. On: 03/12/2020 08:54     Assessment and plan- Patient is a 49 y.o. female with left breast LCIS s/p lumpectomy  We have confirmed with pathology that there was no dcis on biopsy. Final path showed florid variant LCIS. No DCIS. She therefore does not need radiation therapy adjuvantly.  Hormone levels are still pre menopausal. She will therefore benefit from tamoxifen for chemoprevention for 5 years. She will start taking it today. She will also take calicum 3016 mg daily along with vit D 800 IU.  I will see her in 3 months for video visit   Visit Diagnosis 1. Breast neoplasm, Tis (LCIS), left      Dr. Randa Evens, MD, MPH [facility] at [facility] [phone] 03/22/2020 11:37 AM

## 2020-03-22 NOTE — Progress Notes (Signed)
Radiation Oncology Follow up Note  Name: Penny Acosta   Date:   03/22/2020 MRN:  [id number] DOB: 1971-07-13    This 49 y.o. 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  She had MRI findings requiring biopsy of her right breast which turned out to be.  Fibrocystic changes with apocrine metaplasia negative for carcinoma.  We reviewed her case in our tumor conference of her left breast even though it was initially reported in pathology to treat this as a DCIS what that was meant to indicate was clear margins.  Whole breast radiation was not recommended.  She is seen today and doing well.  COMPLICATIONS OF TREATMENT: none  FOLLOW UP COMPLIANCE: keeps appointments   PHYSICAL EXAM:  BP 107/68 (BP Location: Left Arm, Patient Position: Sitting)   Pulse 74   Temp (!) 96.7 F (35.9 C) (Tympanic)   Resp 16   Wt 201 lb 1.6 oz (91.2 kg)   BMI 33.46 kg/m  Well-developed well-nourished patient in NAD. HEENT reveals PERLA, EOMI, discs not visualized.  Oral cavity is clear. No oral mucosal lesions are identified. Neck is clear without evidence of cervical or supraclavicular adenopathy. Lungs are clear to A&P. Cardiac examination is essentially unremarkable with regular rate and rhythm without murmur rub or thrill. Abdomen is benign with no organomegaly or masses noted. Motor sensory and DTR levels are equal and symmetric in the upper and lower extremities. Cranial nerves II through XII are grossly intact. Proprioception is intact. No peripheral adenopathy or edema is identified. No motor or sensory levels are noted. Crude visual fields are within normal range.  RADIOLOGY RESULTS: MRI scans reviewed  PLAN: Present time we  will not offer whole breast radiation based on the lobular carcinoma in situ pathology.  Patient is seeing Dr. Janese Banks today for probable consideration of antiestrogen therapy.  I would be happy to reevaluate this patient in the future.  My recommendations were based on her tumor board findings and recommendations.  I would like to take this opportunity to thank you for allowing me to participate in the care of your patient.Noreene Filbert, MD

## 2020-03-27 ENCOUNTER — Telehealth: Payer: BC Managed Care – PPO | Admitting: Oncology

## 2020-04-02 ENCOUNTER — Encounter: Payer: BC Managed Care – PPO | Admitting: Gynecology

## 2020-04-02 ENCOUNTER — Encounter: Payer: BC Managed Care – PPO | Admitting: Obstetrics and Gynecology

## 2020-04-05 ENCOUNTER — Inpatient Hospital Stay (HOSPITAL_BASED_OUTPATIENT_CLINIC_OR_DEPARTMENT_OTHER): Payer: BC Managed Care – PPO | Admitting: Licensed Clinical Social Worker

## 2020-04-05 ENCOUNTER — Inpatient Hospital Stay: Payer: BC Managed Care – PPO

## 2020-04-05 ENCOUNTER — Encounter: Payer: Self-pay | Admitting: Licensed Clinical Social Worker

## 2020-04-05 ENCOUNTER — Encounter: Payer: Self-pay | Admitting: Oncology

## 2020-04-05 ENCOUNTER — Other Ambulatory Visit: Payer: Self-pay

## 2020-04-05 DIAGNOSIS — Z8042 Family history of malignant neoplasm of prostate: Secondary | ICD-10-CM

## 2020-04-05 DIAGNOSIS — D0502 Lobular carcinoma in situ of left breast: Secondary | ICD-10-CM

## 2020-04-05 DIAGNOSIS — Z800 Family history of malignant neoplasm of digestive organs: Secondary | ICD-10-CM | POA: Diagnosis not present

## 2020-04-05 NOTE — Progress Notes (Signed)
REFERRING PROVIDER: Sindy Guadeloupe, MD [place],  [place] [address]  PRIMARY PROVIDER:  Dion Body, MD  PRIMARY REASON FOR VISIT:  1. Breast neoplasm, Tis (LCIS), left   2. Family history of pancreatic cancer   3. Family history of prostate cancer      HISTORY OF PRESENT ILLNESS:   Penny Acosta, a 49 y.o. female, was seen for a [facility] cancer genetics consultation at the request of Dr. Janese Banks due to a personal and family history of cancer.  Penny Acosta presents to clinic today to discuss the possibility of a hereditary predisposition to cancer, genetic testing, and to further clarify her future cancer risks, as well as potential cancer risks for family members.   In 2021, at the age of 26, Penny Acosta was diagnosed with LCIS of the left breast. The treatment plan included lumpectomy and she is currently taking tamoxifen.    CANCER HISTORY:  Oncology History   No history exists.     RISK FACTORS:  Menarche was at age 46.  First live birth at age 35  OCP use for approximately 5 + years.  Ovaries intact: yes.  Hysterectomy: no.  HRT use: 0 years. Colonoscopy: yes; normal. Mammogram within the last year: yes. Number of breast biopsies: 2. Up to date with pelvic exams: yes. Any excessive radiation exposure in the past: no  Past Medical History:  Diagnosis Date  . Anxiety   . Breast cancer ([place])   . Cancer ([place])   . Complication of anesthesia   . Family history of pancreatic cancer   . Family history of prostate cancer   . GERD (gastroesophageal reflux disease)    occ  . IUD 02/2016   MIRENA INSERTED 11/2005, 10/2010. 02/2016  . PONV (postoperative nausea and vomiting)     Past Surgical History:  Procedure Laterality Date  . BREAST BIOPSY Left 01/03/2020   [facility] UOQ, ribbon marker, path pending  . CESAREAN SECTION  2007   TWINS  . INTRAUTERINE DEVICE INSERTION  02/2016   mirena  . PART MASTECTOMY,RADIO FREQUENCY LOCALIZER,AXILLARY SENTINEL  NODE BIOPSY Left 01/20/2020   Procedure: PARTIAL MASTECTOMY WITH Radio Frequency AND AXILLARY SENTINEL LYMPH NODE BX;  Surgeon: Herbert Pun, MD;  Location: [facility] ORS;  Service: General;  Laterality: Left;  . REDUCTION MAMMAPLASTY      Social History   Socioeconomic History  . Marital status: Married    Spouse name: Not on file  . Number of children: Not on file  . Years of education: Not on file  . Highest education level: Not on file  Occupational History  . Not on file  Tobacco Use  . Smoking status: Never Smoker  . Smokeless tobacco: Never Used  Vaping Use  . Vaping Use: Never used  Substance and Sexual Activity  . Alcohol use: Yes    Alcohol/week: 0.0 standard drinks    Comment: rare  . Drug use: No  . Sexual activity: Yes    Birth control/protection: I.U.D.    Comment: Mirena Inserted 02/2016 -1st intercourse 49 yo-More than 5 partners  Other Topics Concern  . Not on file  Social History Narrative  . Not on file   Social Determinants of Health   Financial Resource Strain:   . Difficulty of Paying Living Expenses:   Food Insecurity:   . Worried About Charity fundraiser in the Last Year:   . Arboriculturist in the Last Year:   Transportation Needs:   .  Lack of Transportation (Medical):   Marland Kitchen Lack of Transportation (Non-Medical):   Physical Activity:   . Days of Exercise per Week:   . Minutes of Exercise per Session:   Stress:   . Feeling of Stress :   Social Connections:   . Frequency of Communication with Friends and Family:   . Frequency of Social Gatherings with Friends and Family:   . Attends Religious Services:   . Active Member of Clubs or Organizations:   . Attends Archivist Meetings:   Marland Kitchen Marital Status:      FAMILY HISTORY:  We obtained a detailed, 4-generation family history.  Significant diagnoses are listed below: Family History  Problem Relation Age of Onset  . Diabetes Father   . Parkinsonism Father   . Heart failure  Father   . Prostate cancer Father        dx 60s  . Colon polyps Father   . Turner syndrome Sister   . Cancer Mother 76       pancreatic capsulated, found when took gallbaldder out  . Colon polyps Mother   . Breast cancer Neg Hx    Penny Acosta has twin son and daughter, they are 51. She has a maternal half brother and half sister. Her half sister has Turner syndrome. No cancers.  Penny Acosta mother was incidentally found to have a small pancreatic cancer when she had gallbladder surgery at 63. She is living at 35. She has history of colon polyps. Patient had 2 maternal uncles, no cancers. No cancers in maternal cousins. Maternal grandmother died of heart issues at 4. Her mother did die of cancer but unsure the type. Maternal grandfather died at 45.   Penny Acosta father had prostate cancer in his 16s, he died at 81. He had history of colon polyps, patient believes 3-4. Patient has 1 paternal aunt but she may be adopted and patient does not have information about her. Paternal grandmother died in her 44s. Grandfather had cancer when patient's father was young but they do not know the type.   Penny Acosta is unaware of previous family history of genetic testing for hereditary cancer risks. Patient's maternal ancestors are of [place] descent, and paternal ancestors are of [place] and [place] descent. There is no reported Ashkenazi Jewish ancestry. There is no known consanguinity.  GENETIC COUNSELING ASSESSMENT: Penny Acosta is a 49 y.o. female with a personal and family history which is somewhat suggestive of a hereditary cancer syndrome and predisposition to cancer. We, therefore, discussed and recommended the following at today's visit.   DISCUSSION: We discussed that 5-10% of cancer is hereditary meaning that it is due to a mutation in a single gene that is passed down from generation to generation in a family. Most cases of hereditary breast/prostate/pancreatic cancer are associated with  BRCA1/2 genes, although there are other genes associated with hereditary cancer as well.  We discussed that testing is beneficial for several reasons including knowing about other cancer risks, identifying potential screening and risk-reduction options that may be appropriate, and to understand if other family members could be at risk for cancer and allow them to undergo genetic testing.   We reviewed the characteristics, features and inheritance patterns of hereditary cancer syndromes. We also discussed genetic testing, including the appropriate family members to test, the process of testing, insurance coverage and turn-around-time for results. We discussed the implications of a negative, positive and/or variant of uncertain significant result. We recommended Ms. Mirza pursue genetic testing  for the [facility] Common Hereditary Cancers gene panel.   The Common Hereditary Cancers Panel offered by [facility] includes sequencing and/or deletion duplication testing of the following 48 genes: APC, ATM, AXIN2, BARD1, BMPR1A, BRCA1, BRCA2, BRIP1, CDH1, CDKN2A (p14ARF), CDKN2A (p16INK4a), CKD4, CHEK2, CTNNA1, DICER1, EPCAM (Deletion/duplication testing only), GREM1 (promoter region deletion/duplication testing only), KIT, MEN1, MLH1, MSH2, MSH3, MSH6, MUTYH, NBN, NF1, NHTL1, PALB2, PDGFRA, PMS2, POLD1, POLE, PTEN, RAD50, RAD51C, RAD51D, RNF43, SDHB, SDHC, SDHD, SMAD4, SMARCA4. STK11, TP53, TSC1, TSC2, and VHL.  The following genes were evaluated for sequence changes only: SDHA and HOXB13 c.251G>A variant only.  Based on Ms. Goebel's family history of cancer, she meets medical criteria for genetic testing. Despite that she meets criteria, she may still have an out of pocket cost. We discussed that if her out of pocket cost for testing is over $100, the laboratory will call and confirm whether she wants to proceed with testing.  If the out of pocket cost of testing is less than $100 she will be billed by the genetic  testing laboratory.   PLAN: After considering the risks, benefits, and limitations, Ms. Khatoon provided informed consent to pursue genetic testing and the blood sample was sent to [facility] for analysis of the Common Hereditary Cancers Panel. Results should be available within approximately 2-3 weeks' time, at which point they will be disclosed by telephone to Ms. Gornick, as will any additional recommendations warranted by these results. Ms. Delosreyes will receive a summary of her genetic counseling visit and a copy of her results once available. This information will also be available in Epic.   Ms. Mee questions were answered to her satisfaction today. Our contact information was provided should additional questions or concerns arise. Thank you for the referral and allowing [place] to share in the care of your patient.   Faith Rogue, MS, [facility] Genetic Counselor [email or web address] Phone: [phone]  The patient was seen for a total of 40 minutes in face-to-face genetic counseling. Dr. Grayland Ormond was available for discussion regarding this case.   _______________________________________________________________________ For Office Staff:  Number of people involved in session: 1 Was an Intern/ student involved with case: no

## 2020-04-09 ENCOUNTER — Other Ambulatory Visit: Payer: Self-pay

## 2020-04-09 ENCOUNTER — Encounter: Payer: Self-pay | Admitting: Obstetrics and Gynecology

## 2020-04-09 ENCOUNTER — Ambulatory Visit: Payer: BC Managed Care – PPO | Admitting: Obstetrics and Gynecology

## 2020-04-09 VITALS — BP 112/66 | Ht 62.5 in | Wt 197.0 lb

## 2020-04-09 DIAGNOSIS — Z853 Personal history of malignant neoplasm of breast: Secondary | ICD-10-CM | POA: Diagnosis not present

## 2020-04-09 DIAGNOSIS — Z01419 Encounter for gynecological examination (general) (routine) without abnormal findings: Secondary | ICD-10-CM

## 2020-04-09 NOTE — Progress Notes (Signed)
Penny Acosta 1970/10/06 [id number]  SUBJECTIVE:  49 y.o. G2P2002 female for annual routine gynecologic exam. She has no gynecologic concerns. Started on Tamoxifen a few weeks ago and other than some fatigue she has had minimal side effects.  Current Outpatient Medications  Medication Sig Dispense Refill  . cyclobenzaprine (FLEXERIL) 10 MG tablet Take 10 mg by mouth 3 (three) times daily as needed for muscle spasms.    Marland Kitchen ibuprofen (ADVIL) 200 MG tablet Take 400 mg by mouth every 6 (six) hours as needed.    Marland Kitchen LORazepam (ATIVAN) 0.5 MG tablet Take 1 tablet (0.5 mg total) by mouth at bedtime. 20 tablet 0  . Multiple Vitamin (MULTIVITAMIN WITH MINERALS) TABS tablet Take 1 tablet by mouth daily.    . tamoxifen (NOLVADEX) 20 MG tablet Take 1 tablet (20 mg total) by mouth daily. 30 tablet 3   No current facility-administered medications for this visit.   Allergies: Patient has no known allergies.  Patient's last menstrual period was 03/14/2020.  Past medical history,surgical history, problem list, medications, allergies, family history and social history were all reviewed and documented as reviewed in the EPIC chart.  ROS:  Feeling well. No dyspnea or chest pain on exertion.  No abdominal pain, change in bowel habits, black or bloody stools.  No urinary tract symptoms. GYN ROS: no abnormal bleeding, pelvic pain or discharge, no breast pain or new or enlarging lumps on self exam. No neurological complaints.   OBJECTIVE:  BP 112/66   Ht 5' 2.5" (1.588 m)   Wt 197 lb (89.4 kg)   LMP 03/14/2020   BMI 35.46 kg/m  The patient appears well, alert, oriented x 3, in no distress. ENT normal.  Neck supple. No cervical or supraclavicular adenopathy or thyromegaly.  Lungs are clear, good air entry, no wheezes, rhonchi or rales. S1 and S2 normal, no murmurs, regular rate and rhythm.  Abdomen soft without tenderness, guarding, mass or organomegaly.  Neurological is normal, no focal  findings.  BREAST EXAM: breasts appear normal, no suspicious masses, no skin or nipple changes or axillary nodes, well healed left lumpectomy scar  PELVIC EXAM: VULVA: normal appearing vulva with no masses, tenderness or lesions, VAGINA: normal appearing vagina with normal color and discharge, no lesions, CERVIX: normal appearing cervix without discharge or lesions, UTERUS: uterus is normal size, shape, consistency and nontender, ADNEXA: normal adnexa in size, nontender and no masses  Chaperone: [name] present during the examination  ASSESSMENT:  49 y.o. [id number] here for annual gynecologic exam  PLAN:   1. No hormonal or menstrual concerns.   [place] level on 02/09/2020 was 5.9.  She had one period since the IUD was removed about 2 months ago but no vaginal bleeding since. 2. Pap smear 03/2019.  No history of abnormal Pap smears.  Next Pap smear due 2023 following the current guidelines recommending the 3 year interval. 3. Contraception.  Recently had Mirena IUD removed last month due to recent diagnosis of breast cancer.  Using condoms for now.  Likely will transition into menopause especially being on tamoxifen at this point. 4. 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  Followed by oncology, determined that she does not need radiation.  She has started on tamoxifen therapy and so far has had minimal side effects.  Potential for vasomotor symptoms reviewed, also discussed potential for some nonhormonal treatments for hot flashes if that becomes a significant issue.  She states she recently had blood work for genetic testing which is  pending, we will look for those results when they are available.  5. Colonoscopy 11/2019.  Repeat at the recommended interval. 6. Health maintenance.  No labs today as she normally has these completed elsewhere with her primary care provider ([facility]).  Return annually or sooner, prn.  Joseph Pierini MD 04/09/20

## 2020-04-24 ENCOUNTER — Telehealth: Payer: Self-pay | Admitting: Licensed Clinical Social Worker

## 2020-04-24 ENCOUNTER — Encounter: Payer: Self-pay | Admitting: Licensed Clinical Social Worker

## 2020-04-24 ENCOUNTER — Ambulatory Visit: Payer: Self-pay | Admitting: Licensed Clinical Social Worker

## 2020-04-24 DIAGNOSIS — Z1379 Encounter for other screening for genetic and chromosomal anomalies: Secondary | ICD-10-CM

## 2020-04-24 DIAGNOSIS — D0502 Lobular carcinoma in situ of left breast: Secondary | ICD-10-CM

## 2020-04-24 DIAGNOSIS — Z8042 Family history of malignant neoplasm of prostate: Secondary | ICD-10-CM

## 2020-04-24 DIAGNOSIS — Z800 Family history of malignant neoplasm of digestive organs: Secondary | ICD-10-CM

## 2020-04-24 NOTE — Progress Notes (Signed)
HPI:  Ms. Pokorny was previously seen in the [place] clinic due to a personal and family history of cancer and concerns regarding a hereditary predisposition to cancer. Please refer to our prior cancer genetics clinic note for more information regarding our discussion, assessment and recommendations, at the time. Ms. Riel recent genetic test results were disclosed to her, as were recommendations warranted by these results. These results and recommendations are discussed in more detail below.  CANCER HISTORY:  Oncology History   No history exists.    FAMILY HISTORY:  We obtained a detailed, 4-generation family history.  Significant diagnoses are listed below: Family History  Problem Relation Age of Onset  . Diabetes Father   . Parkinsonism Father   . Heart failure Father   . Prostate cancer Father        dx 59s  . Colon polyps Father   . Turner syndrome Sister   . Cancer Mother 75       pancreatic capsulated, found when took gallbaldder out  . Colon polyps Mother   . Breast cancer Neg Hx    Ms. Donahoe has twin son and daughter, they are 75. She has a maternal half brother and half sister. Her half sister has Turner syndrome. No cancers.  Ms. Roadcap mother was incidentally found to have a small pancreatic cancer when she had gallbladder surgery at 21. She is living at 71. She has history of colon polyps. Patient had 2 maternal uncles, no cancers. No cancers in maternal cousins. Maternal grandmother died of heart issues at 70. Her mother did die of cancer but unsure the type. Maternal grandfather died at 52.   Ms. Sheaffer father had prostate cancer in his 99s, he died at 3. He had history of colon polyps, patient believes 3-4. Patient has 1 paternal aunt but she may be adopted and patient does not have information about her. Paternal grandmother died in her 81s. Grandfather had cancer when patient's father was young but they do not know the type.   Ms.  Geng is unaware of previous family history of genetic testing for hereditary cancer risks. Patient's maternal ancestors are of [place] descent, and paternal ancestors are of [place] and [place] descent. There is no reported Ashkenazi Jewish ancestry. There is no known consanguinity.   GENETIC TEST RESULTS: Genetic testing reported out on 04/20/2020 through the [facility] Common Hereditary cancer panel found no pathogenic mutations.   The Common Hereditary Cancers Panel offered by [facility] includes sequencing and/or deletion duplication testing of the following 48 genes: APC, ATM, AXIN2, BARD1, BMPR1A, BRCA1, BRCA2, BRIP1, CDH1, CDKN2A (p14ARF), CDKN2A (p16INK4a), CKD4, CHEK2, CTNNA1, DICER1, EPCAM (Deletion/duplication testing only), GREM1 (promoter region deletion/duplication testing only), KIT, MEN1, MLH1, MSH2, MSH3, MSH6, MUTYH, NBN, NF1, NHTL1, PALB2, PDGFRA, PMS2, POLD1, POLE, PTEN, RAD50, RAD51C, RAD51D, RNF43, SDHB, SDHC, SDHD, SMAD4, SMARCA4. STK11, TP53, TSC1, TSC2, and VHL.  The following genes were evaluated for sequence changes only: SDHA and HOXB13 c.251G>A variant only.   The test report has been scanned into EPIC and is located under the Molecular Pathology section of the Results Review tab.  A portion of the result report is included below for reference.     We discussed with Ms. Aguallo that because current genetic testing is not perfect, it is possible there may be a gene mutation in one of these genes that current testing cannot detect, but that chance is small.  We also discussed, that there could be another gene that has not  yet been discovered, or that we have not yet tested, that is responsible for the cancer diagnoses in the family. It is also possible there is a hereditary cause for the cancer in the family that Ms. Alire did not inherit and therefore was not identified in her testing.  Therefore, it is important to remain in touch with cancer genetics in the future so that we can  continue to offer Ms. Brekke the most up to date genetic testing.   Genetic testing did identify a variant of uncertain significance (VUS) in the BRIP1 gene called c.550G>T.  At this time, it is unknown if this variant is associated with increased cancer risk or if this is a normal finding, but most variants such as this get reclassified to being inconsequential. It should not be used to make medical management decisions. With time, we suspect the lab will determine the significance of this variant, if any. If we do learn more about it, we will try to contact Ms. Vader to discuss it further. However, it is important to stay in touch with [place] periodically and keep the address and phone number up to date.  ADDITIONAL GENETIC TESTING: We discussed with Ms. Gryder that her genetic testing was fairly extensive.  If there are genes identified to increase cancer risk that can be analyzed in the future, we would be happy to discuss and coordinate this testing at that time.    CANCER SCREENING RECOMMENDATIONS: Ms. Heft test result is considered negative (normal).  This means that we have not identified a hereditary cause for her  personal and family history of cancer at this time.  While reassuring, this does not definitively rule out a hereditary predisposition to cancer. It is still possible that there could be genetic mutations that are undetectable by current technology. There could be genetic mutations in genes that have not been tested or identified to increase cancer risk.  Therefore, it is recommended she continue to follow the cancer management and screening guidelines provided by her oncology and primary healthcare provider.   An individual's cancer risk and medical management are not determined by genetic test results alone. Overall cancer risk assessment incorporates additional factors, including personal medical history, family history, and any available genetic information that may result in a  personalized plan for cancer prevention and surveillance.  RECOMMENDATIONS FOR FAMILY MEMBERS:  Relatives in this family might be at some increased risk of developing cancer, over the general population risk, simply due to the family history of cancer.  We recommended female relatives in this family have a yearly mammogram beginning at age 9, or 61 years younger than the earliest onset of cancer, an annual clinical breast exam, and perform monthly breast self-exams. Female relatives in this family should also have a gynecological exam as recommended by their primary provider.  All family members should be referred for colonoscopy starting at age 47.   It is also possible there is a hereditary cause for the cancer in Ms. Benda's family that she did not inherit and therefore was not identified in her.  Based on Ms. Costanzo's family history, we recommended her mother/maternal relatives have genetic counseling and testing. Ms. Ashby will let us know if we can be of any assistance in coordinating genetic counseling and/or testing for these family members.  FOLLOW-UP: Lastly, we discussed with Ms. Chim that cancer genetics is a rapidly advancing field and it is possible that new genetic tests will be appropriate for her and/or her family members  in the future. We encouraged her to remain in contact with cancer genetics on an annual basis so we can update her personal and family histories and let her know of advances in cancer genetics that may benefit this family.   Our contact number was provided. Ms. Kueker questions were answered to her satisfaction, and she knows she is welcome to call us at anytime with additional questions or concerns.   Faith Rogue, MS, [facility] Genetic Counselor [email or web address] Phone: [phone]

## 2020-04-24 NOTE — Telephone Encounter (Signed)
Revealed negative genetic testing.  Revealed that a VUS in BRIP1 was identified. This normal result is reassuring and indicates that it is unlikely Penny Acosta's cancer was due to a hereditary cause.  It is unlikely that there is an increased risk of another cancer due to a mutation in one of these genes.  However, genetic testing is not perfect, and cannot definitively rule out a hereditary cause.  It will be important for her to keep in contact with genetics to learn if any additional testing may be needed in the future.

## 2020-05-14 ENCOUNTER — Encounter: Payer: Self-pay | Admitting: Oncology

## 2020-05-31 ENCOUNTER — Encounter: Payer: Self-pay | Admitting: Obstetrics and Gynecology

## 2020-05-31 DIAGNOSIS — F19982 Other psychoactive substance use, unspecified with psychoactive substance-induced sleep disorder: Secondary | ICD-10-CM | POA: Insufficient documentation

## 2020-05-31 NOTE — Telephone Encounter (Signed)
I would not suggest regular Ativan use as that is an addictive type of medication.  Probably a safe for medication for insomnia would be Ambien or medications in that class.

## 2020-06-16 ENCOUNTER — Other Ambulatory Visit: Payer: Self-pay | Admitting: Oncology

## 2020-06-20 ENCOUNTER — Other Ambulatory Visit: Payer: Self-pay | Admitting: Oncology

## 2020-06-20 ENCOUNTER — Other Ambulatory Visit: Payer: BC Managed Care – PPO

## 2020-06-22 ENCOUNTER — Telehealth: Payer: BC Managed Care – PPO | Admitting: Oncology

## 2020-06-25 ENCOUNTER — Other Ambulatory Visit: Payer: Self-pay

## 2020-06-25 ENCOUNTER — Other Ambulatory Visit: Payer: Self-pay | Admitting: Oncology

## 2020-06-25 ENCOUNTER — Encounter: Payer: Self-pay | Admitting: Oncology

## 2020-06-25 MED ORDER — TAMOXIFEN CITRATE 20 MG PO TABS: 20.0000 mg | ORAL_TABLET | Freq: Every day | ORAL | 2 refills | Status: DC

## 2020-06-27 ENCOUNTER — Other Ambulatory Visit: Payer: Self-pay

## 2020-06-27 ENCOUNTER — Inpatient Hospital Stay: Payer: BC Managed Care – PPO | Attending: Oncology

## 2020-06-27 DIAGNOSIS — D0502 Lobular carcinoma in situ of left breast: Secondary | ICD-10-CM

## 2020-06-27 LAB — COMPREHENSIVE METABOLIC PANEL
ALT: 19 U/L (ref 0–44)
AST: 22 U/L (ref 15–41)
Albumin: 3.6 g/dL (ref 3.5–5.0)
Alkaline Phosphatase: 26 U/L — ABNORMAL LOW (ref 38–126)
Anion gap: 7 (ref 5–15)
BUN: 20 mg/dL (ref 6–20)
CO2: 24 mmol/L (ref 22–32)
Calcium: 8.3 mg/dL — ABNORMAL LOW (ref 8.9–10.3)
Chloride: 105 mmol/L (ref 98–111)
Creatinine, Ser: 0.91 mg/dL (ref 0.44–1.00)
GFR, Estimated: >60 mL/min (ref >60)
Glucose, Bld: 100 mg/dL — ABNORMAL HIGH (ref 70–99)
Potassium: 3.9 mmol/L (ref 3.5–5.1)
Sodium: 136 mmol/L (ref 135–145)
Total Bilirubin: 0.6 mg/dL (ref 0.3–1.2)
Total Protein: 6.9 g/dL (ref 6.5–8.1)

## 2020-06-29 ENCOUNTER — Inpatient Hospital Stay (HOSPITAL_BASED_OUTPATIENT_CLINIC_OR_DEPARTMENT_OTHER): Payer: BC Managed Care – PPO | Admitting: Oncology

## 2020-06-29 DIAGNOSIS — Z79810 Long term (current) use of selective estrogen receptor modulators (SERMs): Secondary | ICD-10-CM | POA: Diagnosis not present

## 2020-06-29 DIAGNOSIS — D0502 Lobular carcinoma in situ of left breast: Secondary | ICD-10-CM | POA: Diagnosis not present

## 2020-06-29 DIAGNOSIS — Z5181 Encounter for therapeutic drug level monitoring: Secondary | ICD-10-CM

## 2020-07-05 NOTE — Progress Notes (Signed)
I connected with Penny Acosta on 07/05/20 at  2:45 PM EDT by video enabled telemedicine visit and verified that I am speaking with the correct person using two identifiers.   I discussed the limitations, risks, security and privacy concerns of performing an evaluation and management service by telemedicine and the availability of in-person appointments. I also discussed with the patient that there may be a patient responsible charge related to this service. The patient expressed understanding and agreed to proceed.  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 This was followed by a biopsy which showed intermediate gradein situ carcinomawith calcifications and comedonecrosis with a focus worrisome for microinvasive carcinoma. biopsy specimen showed mixed features of dcis and lcis  Patient has a Mirena IUD in place but reports that her hormone levels were premenopausal. She has not had any prior abnormal breast biopsies. Family history significant for pancreatic cancer in her mother. And prostate cancer  in her father. No family history of breast cancer. She is G1, P1 L1. She has not had any menstrual cycles after insertion of Mirena.her Mirena has been taken out.   She had b/l breast MRI which showed lobular mass in the right breast and possible complex cyst in the left breast. Left breast cyst was a benign sebaceous cyst. Right breast biopsy showed  FIBROCYSTIC CHANGE WITH APOCRINE METAPLASIA, PSEUDOANGIOMATOUS STROMAL HYPERPLASIA  Interval history: Patient is still struggling with insomnia and is currently taking trazodone for the same.  She is tolerating tamoxifen well and other than  insomnia she does not have any significant hot flashes or joint pains Review of Systems  Constitutional: Negative for chills, fever, malaise/fatigue and weight loss.  HENT: Negative for congestion, ear discharge and nosebleeds.   Eyes: Negative for blurred vision.  Respiratory: Negative for cough, hemoptysis, sputum production, shortness of breath and wheezing.   Cardiovascular: Negative for chest pain, palpitations, orthopnea and claudication.  Gastrointestinal: Negative for abdominal pain, blood in stool, constipation, diarrhea, heartburn, melena, nausea and vomiting.  Genitourinary: Negative for dysuria, flank pain, frequency, hematuria and urgency.  Musculoskeletal: Negative for back pain, joint pain and myalgias.  Skin: Negative for rash.  Neurological: Negative for dizziness, tingling, focal weakness, seizures, weakness and headaches.  Endo/Heme/Allergies: Does not bruise/bleed easily.  Psychiatric/Behavioral: Negative for depression and suicidal ideas. The patient has insomnia.     No Known Allergies  Past Medical History:  Diagnosis Date   Anxiety    Breast cancer ([place])    Cancer ([place])    Complication of anesthesia    GERD (gastroesophageal reflux disease)    occ   IUD 02/2016   MIRENA INSERTED 11/2005, 10/2010. 02/2016   PONV (postoperative nausea and vomiting)     Past Surgical History:  Procedure Laterality Date   BREAST BIOPSY Left 01/03/2020   [facility] UOQ, ribbon marker, path pending   CESAREAN SECTION  2007   TWINS   INTRAUTERINE DEVICE INSERTION  02/2016   mirena   PART MASTECTOMY,RADIO FREQUENCY LOCALIZER,AXILLARY SENTINEL NODE BIOPSY Left 01/20/2020   Procedure: PARTIAL MASTECTOMY WITH Radio Frequency AND AXILLARY SENTINEL LYMPH NODE BX;  Surgeon: Herbert Pun, MD;  Location: [facility] ORS;  Service: General;  Laterality: Left;   REDUCTION MAMMAPLASTY  Social History   Socioeconomic History   Marital status: Married    Spouse name: Not on  file   Number of children: Not on file   Years of education: Not on file   Highest education level: Not on file  Occupational History   Not on file  Tobacco Use   Smoking status: Never Smoker   Smokeless tobacco: Never Used  Vaping Use   Vaping Use: Never used  Substance and Sexual Activity   Alcohol use: Yes    Alcohol/week: 0.0 standard drinks    Comment: rare   Drug use: No   Sexual activity: Yes    Birth control/protection: Condom    Comment: 1st intercourse 49 yo-More than 5 partners  Other Topics Concern   Not on file  Social History Narrative   Not on file   Social Determinants of Health   Financial Resource Strain:    Difficulty of Paying Living Expenses: Not on file  Food Insecurity:    Worried About Charity fundraiser in the Last Year: Not on file   [employer] of Food in the Last Year: Not on file  Transportation Needs:    Lack of Transportation (Medical): Not on file   Lack of Transportation (Non-Medical): Not on file  Physical Activity:    Days of Exercise per Week: Not on file   Minutes of Exercise per Session: Not on file  Stress:    Feeling of Stress : Not on file  Social Connections:    Frequency of Communication with Friends and Family: Not on file   Frequency of Social Gatherings with Friends and Family: Not on file   Attends Religious Services: Not on file   Active Member of Clubs or Organizations: Not on file   Attends Archivist Meetings: Not on file   Marital Status: Not on file  Intimate Partner Violence:    Fear of Current or Ex-Partner: Not on file   Emotionally Abused: Not on file   Physically Abused: Not on file   Sexually Abused: Not on file    Family History  Problem Relation Age of Onset   Diabetes Father    Parkinsonism Father    Heart failure Father    Prostate cancer Father        dx 67s   Colon polyps Father    Turner syndrome Sister    Cancer Mother 33       pancreatic  capsulated, found when took gallbaldder out   Colon polyps Mother    Breast cancer Neg Hx      Current Outpatient Medications:    cyclobenzaprine (FLEXERIL) 10 MG tablet, Take 10 mg by mouth 3 (three) times daily as needed for muscle spasms., Disp: , Rfl:    ibuprofen (ADVIL) 200 MG tablet, Take 400 mg by mouth every 6 (six) hours as needed., Disp: , Rfl:    Multiple Vitamin (MULTIVITAMIN WITH MINERALS) TABS tablet, Take 1 tablet by mouth daily., Disp: , Rfl:    tamoxifen (NOLVADEX) 20 MG tablet, TAKE 1 TABLET BY MOUTH EVERY DAY, Disp: 90 tablet, Rfl: 1   traZODone (DESYREL) 50 MG tablet, TAKE 1 TABLET BY MOUTH NIGHTLY AS NEEDED FOR SLEEP, Disp: , Rfl:    LORazepam (ATIVAN) 0.5 MG tablet, Take 1 tablet (0.5 mg total) by mouth at bedtime., Disp: 20 tablet, Rfl: 0  No results found.  No images are attached to the encounter.   CMP Latest Ref Rng & Units 06/27/2020  Glucose  70 - 99 mg/dL 100(H)  BUN 6 - 20 mg/dL 20  Creatinine 0.44 - 1.00 mg/dL 0.91  Sodium 135 - 145 mmol/L 136  Potassium 3.5 - 5.1 mmol/L 3.9  Chloride 98 - 111 mmol/L 105  CO2 22 - 32 mmol/L 24  Calcium 8.9 - 10.3 mg/dL 8.3(L)  Total Protein 6.5 - 8.1 g/dL 6.9  Total Bilirubin 0.3 - 1.2 mg/dL 0.6  Alkaline Phos 38 - 126 U/L 26(L)  AST 15 - 41 U/L 22  ALT 0 - 44 U/L 19   CBC Latest Ref Rng & Units 03/31/2019  WBC 3.8 - 10.8 Thousand/uL 6.3  Hemoglobin 11.7 - 15.5 g/dL 12.9  Hematocrit 35 - 45 % 39.3  Platelets 140 - 400 Thousand/uL 263     Observation/objective: Appears in no acute distress over video visit today.  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  We discussed trying to continue tamoxifen if possible.  She is currently on trazodone for insomnia but if she has any worsening side effects from tamoxifen it would be reasonable to  stop it since she is getting it more for chemoprevention for LCIS.  She will let us know if she decides to stop taking tamoxifen.  I will see her back in 6 months and she will needed mammogram in March 2022 which we will order   Follow-up instructions: As above  I discussed the assessment and treatment plan with the patient. The patient was provided an opportunity to ask questions and all were answered. The patient agreed with the plan and demonstrated an understanding of the instructions.   The patient was advised to call back or seek an in-person evaluation if the symptoms worsen or if the condition fails to improve as anticipated.    Visit Diagnosis: 1. Breast neoplasm, Tis (LCIS), left   2. Encounter for monitoring tamoxifen therapy     Dr. Randa Evens, MD, MPH [facility] at [facility] Tel- [phone] 07/05/2020 1:15 PM

## 2020-10-29 DIAGNOSIS — Z853 Personal history of malignant neoplasm of breast: Secondary | ICD-10-CM | POA: Insufficient documentation

## 2020-10-30 DIAGNOSIS — R7303 Prediabetes: Secondary | ICD-10-CM | POA: Insufficient documentation

## 2020-12-10 ENCOUNTER — Ambulatory Visit
Admission: RE | Admit: 2020-12-10 | Discharge: 2020-12-10 | Disposition: A | Discharge status: Home / Self Care | Payer: BC Managed Care – PPO | Source: Ambulatory Visit | Attending: Oncology | Admitting: Oncology

## 2020-12-10 ENCOUNTER — Other Ambulatory Visit: Payer: Self-pay

## 2020-12-10 DIAGNOSIS — D0502 Lobular carcinoma in situ of left breast: Secondary | ICD-10-CM | POA: Diagnosis present

## 2020-12-28 ENCOUNTER — Encounter: Payer: Self-pay | Admitting: Oncology

## 2020-12-28 ENCOUNTER — Inpatient Hospital Stay: Payer: BC Managed Care – PPO | Attending: Oncology | Admitting: Oncology

## 2020-12-28 VITALS — BP 111/46 | HR 70 | Temp 97.0°F | Resp 16 | Wt 177.7 lb

## 2020-12-28 DIAGNOSIS — D0592 Unspecified type of carcinoma in situ of left breast: Secondary | ICD-10-CM | POA: Insufficient documentation

## 2020-12-28 DIAGNOSIS — D0502 Lobular carcinoma in situ of left breast: Secondary | ICD-10-CM

## 2020-12-28 DIAGNOSIS — Z79899 Other long term (current) drug therapy: Secondary | ICD-10-CM | POA: Diagnosis not present

## 2020-12-28 DIAGNOSIS — Z79810 Long term (current) use of selective estrogen receptor modulators (SERMs): Secondary | ICD-10-CM | POA: Insufficient documentation

## 2020-12-28 DIAGNOSIS — F419 Anxiety disorder, unspecified: Secondary | ICD-10-CM | POA: Diagnosis not present

## 2020-12-28 DIAGNOSIS — K219 Gastro-esophageal reflux disease without esophagitis: Secondary | ICD-10-CM | POA: Insufficient documentation

## 2020-12-28 DIAGNOSIS — Z170 Estrogen receptor positive status [ER+]: Secondary | ICD-10-CM | POA: Insufficient documentation

## 2020-12-28 NOTE — Progress Notes (Signed)
Survivorship Care Plan visit completed.  Treatment summary reviewed and given to patient.  ASCO answers booklet reviewed and given to patient.  CARE program and Cancer Transitions discussed with patient along with other resources cancer center offers to patients and caregivers.  Patient verbalized understanding.    

## 2020-12-28 NOTE — Progress Notes (Signed)
Pt here for f/u breast cancer. Feels ok most of times but when she was working out and got up she got a little dizzy for a minute and her PCP said she needs to drink more fluids and get up slowly.recheck b/p 106/45 and all she had to eat was grapes and glass of water. She was getting ready to get on airplane to visit family. She does not feel dizzy getting up or down. She is going home and eating before she leaves for airport

## 2020-12-30 NOTE — Progress Notes (Signed)
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 This was followed by a biopsy which showed intermediate gradein situ carcinomawith calcifications and comedonecrosis with a focus worrisome for microinvasive carcinoma. biopsy specimen showed mixed features of dcis and lcis.  She had b/l breast MRI which showed lobular mass in the right breast and possible complex cyst in the left breast. Left breast cyst was a benign sebaceous cyst. Right breast biopsy showedFIBROCYSTIC CHANGE WITH APOCRINE METAPLASIA, PSEUDOANGIOMATOUS STROMAL HYPERPLASIA.  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  ER greater than 90% positive  Interval history-patient is tolerating tamoxifen well without any significant side effects.  Insomnia is intermittent and she does not use trazodone daily.  ECOG PS- 0 Pain scale- 0   Review of systems- Review of Systems  Constitutional: Negative for chills, fever, malaise/fatigue and weight loss.  HENT: Negative for congestion, ear discharge and nosebleeds.   Eyes: Negative for blurred vision.  Respiratory: Negative for cough, hemoptysis, sputum production, shortness of breath and wheezing.   Cardiovascular: Negative for chest pain,  palpitations, orthopnea and claudication.  Gastrointestinal: Negative for abdominal pain, blood in stool, constipation, diarrhea, heartburn, melena, nausea and vomiting.  Genitourinary: Negative for dysuria, flank pain, frequency, hematuria and urgency.  Musculoskeletal: Negative for back pain, joint pain and myalgias.  Skin: Negative for rash.  Neurological: Negative for dizziness, tingling, focal weakness, seizures, weakness and headaches.  Endo/Heme/Allergies: Does not bruise/bleed easily.  Psychiatric/Behavioral: Negative for depression and suicidal ideas. The patient has insomnia.       No Known Allergies   Past Medical History:  Diagnosis Date  . Anxiety   . Breast cancer ([place])   . Cancer ([place])   . Complication of anesthesia   . GERD (gastroesophageal reflux disease)    occ  . IUD 02/2016   MIRENA INSERTED 11/2005, 10/2010. 02/2016  . PONV (postoperative nausea and vomiting)      Past Surgical History:  Procedure Laterality Date  . BREAST BIOPSY Left 01/03/2020   [facility] UOQ, ribbon marker, path pending  . BREAST LUMPECTOMY    . CESAREAN SECTION  2007   TWINS  . INTRAUTERINE DEVICE INSERTION  02/2016   mirena  . PART MASTECTOMY,RADIO FREQUENCY LOCALIZER,AXILLARY SENTINEL NODE BIOPSY Left 01/20/2020   Procedure: PARTIAL MASTECTOMY WITH Radio Frequency AND AXILLARY SENTINEL LYMPH NODE BX;  Surgeon: Herbert Pun, MD;  Location: [facility] ORS;  Service: General;  Laterality: Left;  . REDUCTION MAMMAPLASTY      Social History   Socioeconomic History  . Marital status: Married    Spouse name: Not on file  . Number of children: Not on file  . Years of education: Not on file  . Highest education level: Not on file  Occupational History  . Not on file  Tobacco Use  . Smoking status: Never Smoker  . Smokeless tobacco: Never Used  Vaping Use  . Vaping Use: Never used  Substance and Sexual Activity  . Alcohol use: Yes    Alcohol/week: 0.0 standard drinks    Comment:  rare  . Drug use: No  . Sexual activity: Yes    Birth control/protection: Condom    Comment: 1st intercourse 50 yo-More than 5 partners  Other Topics Concern  . Not on file  Social History Narrative  . Not on file   Social Determinants of Health   Financial Resource Strain: Not on file  Food Insecurity: Not on file  Transportation Needs: Not on file  Physical Activity: Not on file  Stress: Not on file  Social Connections: Not on file  Intimate Partner Violence: Not on file    Family History  Problem Relation Age of Onset  . Diabetes Father   . Parkinsonism Father   . Heart failure Father   . Prostate cancer Father        dx 53s  . Colon polyps Father   . Turner syndrome Sister   . Cancer Mother 59       pancreatic capsulated, found when took gallbaldder out  . Colon polyps Mother   . Breast cancer Neg Hx      Current Outpatient Medications:  .  cyclobenzaprine (FLEXERIL) 10 MG tablet, Take 10 mg by mouth 3 (three) times daily as needed for muscle spasms., Disp: , Rfl:  .  ibuprofen (ADVIL) 200 MG tablet, Take 400 mg by mouth every 6 (six) hours as needed., Disp: , Rfl:  .  Multiple Vitamin (MULTIVITAMIN WITH MINERALS) TABS tablet, Take 1 tablet by mouth daily., Disp: , Rfl:  .  tamoxifen (NOLVADEX) 20 MG tablet, TAKE 1 TABLET BY MOUTH EVERY DAY, Disp: 90 tablet, Rfl: 1 .  traZODone (DESYREL) 50 MG tablet, TAKE 1 TABLET BY MOUTH NIGHTLY AS NEEDED FOR SLEEP, Disp: , Rfl:   Physical exam:  Vitals:   12/28/20 1311  BP: (!) 111/46  Pulse: 70  Resp: 16  Temp: (!) 97 F (36.1 C)  TempSrc: Tympanic  SpO2: 100%  Weight: 177 lb 11.2 oz (80.6 kg)   Physical Exam Constitutional:      General: She is not in acute distress. Cardiovascular:     Rate and Rhythm: Normal rate and regular rhythm.     Heart sounds: Normal heart sounds.  Pulmonary:     Effort: Pulmonary effort is normal.     Breath sounds: Normal breath sounds.  Abdominal:     General: Bowel sounds are  normal.     Palpations: Abdomen is soft.  Skin:    General: Skin is warm and dry.  Neurological:     Mental Status: She is alert and oriented to person, place, and time.    Breast exam was performed in seated and lying down position. Patient is status post left lumpectomy with a well-healed surgical scar. No evidence of any palpable masses. No evidence of axillary adenopathy. No evidence of any palpable masses or lumps in the right breast. No evidence of right axillary adenopathy   CMP Latest Ref Rng & Units 06/27/2020  Glucose 70 - 99 mg/dL 100(H)  BUN 6 - 20 mg/dL 20  Creatinine 0.44 - 1.00 mg/dL 0.91  Sodium 135 - 145 mmol/L 136  Potassium 3.5 - 5.1 mmol/L 3.9  Chloride 98 - 111 mmol/L 105  CO2 22 - 32 mmol/L 24  Calcium 8.9 - 10.3 mg/dL 8.3(L)  Total Protein 6.5 - 8.1 g/dL 6.9  Total  Bilirubin 0.3 - 1.2 mg/dL 0.6  Alkaline Phos 38 - 126 U/L 26(L)  AST 15 - 41 U/L 22  ALT 0 - 44 U/L 19   CBC Latest Ref Rng & Units 03/31/2019  WBC 3.8 - 10.8 Thousand/uL 6.3  Hemoglobin 11.7 - 15.5 g/dL 12.9  Hematocrit 35.0 - 45.0 % 39.3  Platelets 140 - 400 Thousand/uL 263    No images are attached to the encounter.  MM DIAG BREAST TOMO BILATERAL  Result Date: 12/10/2020 CLINICAL DATA:  Personal history of left breast cancer status post lumpectomy 2021 EXAM: DIGITAL DIAGNOSTIC BILATERAL MAMMOGRAM WITH TOMOSYNTHESIS AND CAD TECHNIQUE: Bilateral digital diagnostic mammography and breast tomosynthesis was performed. The images were evaluated with computer-aided detection. COMPARISON:  Prior films ACR Breast Density Category b: There are scattered areas of fibroglandular density. FINDINGS: Cc and MLO views of bilateral breasts, spot tangential view of left breast are submitted. Postsurgical changes identified in the left breast. No suspicious abnormality is identified bilaterally. IMPRESSION: Benign findings. RECOMMENDATION: Bilateral diagnostic mammogram in 1 year. I have discussed the findings  and recommendations with the patient. If applicable, a reminder letter will be sent to the patient regarding the next appointment. BI-RADS CATEGORY  2: Benign. Electronically Signed   By: Abelardo Diesel M.D.   On: 12/10/2020 14:49     Assessment and plan- Patient is a 50 y.o. female with florid variant of LCIS ER positive here for routine follow-up  Patient is currently on tamoxifen for a left breast floral variant PR positive LCIS which is treated like DCIS and therefore she would need to take it for 5 years.  She is tolerating it well other than intermittent insomnia.  I will see her back in 6 months for a routine follow-up visit.  She  recently had a mammogram in March 2022 which was unremarkable   Visit Diagnosis 1. Breast neoplasm, Tis (LCIS), left      Dr. Randa Evens, MD, MPH [facility] at [facility] [phone] 12/30/2020 7:04 PM

## 2021-03-17 ENCOUNTER — Other Ambulatory Visit: Payer: Self-pay | Admitting: Oncology

## 2021-06-05 ENCOUNTER — Ambulatory Visit: Payer: BC Managed Care – PPO | Admitting: Nurse Practitioner

## 2021-07-01 ENCOUNTER — Encounter: Payer: Self-pay | Admitting: Nurse Practitioner

## 2021-07-01 ENCOUNTER — Other Ambulatory Visit: Payer: Self-pay

## 2021-07-01 ENCOUNTER — Inpatient Hospital Stay: Payer: BC Managed Care – PPO | Attending: Oncology | Admitting: Nurse Practitioner

## 2021-07-01 VITALS — BP 104/76 | HR 73 | Temp 97.5°F | Resp 16 | Wt 181.0 lb

## 2021-07-01 DIAGNOSIS — Z5181 Encounter for therapeutic drug level monitoring: Secondary | ICD-10-CM

## 2021-07-01 DIAGNOSIS — E6609 Other obesity due to excess calories: Secondary | ICD-10-CM | POA: Insufficient documentation

## 2021-07-01 DIAGNOSIS — Z170 Estrogen receptor positive status [ER+]: Secondary | ICD-10-CM | POA: Insufficient documentation

## 2021-07-01 DIAGNOSIS — Z79810 Long term (current) use of selective estrogen receptor modulators (SERMs): Secondary | ICD-10-CM | POA: Diagnosis not present

## 2021-07-01 DIAGNOSIS — D0502 Lobular carcinoma in situ of left breast: Secondary | ICD-10-CM | POA: Diagnosis present

## 2021-07-01 DIAGNOSIS — Z6830 Body mass index (BMI) 30.0-30.9, adult: Secondary | ICD-10-CM | POA: Insufficient documentation

## 2021-07-01 NOTE — Progress Notes (Signed)
Pt has no concerns or complaints at this time

## 2021-07-01 NOTE — Progress Notes (Signed)
Hematology/Oncology Consult Note [facility]  Telephone:([phone] Fax:([phone]  Patient Care Team: Dion Body, MD as PCP - General (Family Medicine) Sindy Guadeloupe, MD as Consulting Physician (Oncology) Herbert Pun, MD as Consulting Physician (General Surgery) Theodore Demark, RN as Registered Nurse   Name of the patient: Penny Acosta  [id number]  September 21, 1970   Date of visit: 07/01/21  Diagnosis-ER positive florid variant of LCIS  Chief complaint/ Reason for visit-routine follow-up of LCIS on tamoxifen  Heme/Onc history: Patient is a 50 year old female who recently underwent a screening mammogram on 12/08/2019 which showed left breast calcifications.  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  biopsy specimen showed mixed features of dcis and lcis.  She had b/l breast MRI which showed lobular mass in the right breast and possible complex cyst in the left breast. Left breast cyst was a benign sebaceous cyst. Right breast biopsy showed  FIBROCYSTIC CHANGE WITH APOCRINE METAPLASIA, PSEUDOANGIOMATOUS STROMAL HYPERPLASIA.  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  ER greater than 90% positive  Interval history- Patient is 50 year old female with above history of LCIS, continues tamoxifen. Tolerating it well without significant side effects. She continues to struggle with insomnia which is improved with use of trazodone. No new breast lumps, bumps, pain, or skin changes.   ECOG PS- 0 Pain scale- 0   Review of systems- Review of Systems  Constitutional:  Negative for chills, fever, malaise/fatigue and weight loss.  HENT:  Negative for congestion, ear discharge and nosebleeds.   Eyes:  Negative for blurred vision.  Respiratory:  Negative for  cough, hemoptysis, sputum production, shortness of breath and wheezing.   Cardiovascular:  Negative for chest pain, palpitations, orthopnea and claudication.  Gastrointestinal:  Negative for abdominal pain, blood in stool, constipation, diarrhea, heartburn, melena, nausea and vomiting.  Genitourinary:  Negative for dysuria, flank pain, frequency, hematuria and urgency.  Musculoskeletal:  Negative for back pain, joint pain and myalgias.  Skin:  Negative for rash.  Neurological:  Negative for dizziness, tingling, focal weakness, seizures, weakness and headaches.  Endo/Heme/Allergies:  Does not bruise/bleed easily.  Psychiatric/Behavioral:  Negative for depression and suicidal ideas. The patient has insomnia.     No Known Allergies  Past Medical History:  Diagnosis Date   Anxiety    Breast cancer ([place])    Cancer ([place])    Complication of anesthesia    GERD (gastroesophageal reflux disease)    occ   IUD 02/2016   MIRENA INSERTED 11/2005, 10/2010. 02/2016   PONV (postoperative nausea and vomiting)    Past Surgical History:  Procedure Laterality Date   BREAST BIOPSY Left 01/03/2020   calcs UOQ, ribbon marker, path pending   BREAST LUMPECTOMY     CESAREAN SECTION  2007   TWINS   INTRAUTERINE DEVICE INSERTION  02/2016   mirena   PART MASTECTOMY,RADIO FREQUENCY LOCALIZER,AXILLARY SENTINEL NODE BIOPSY Left 01/20/2020   Procedure: PARTIAL MASTECTOMY WITH Radio Frequency AND AXILLARY SENTINEL LYMPH NODE BX;  Surgeon: Herbert Pun, MD;  Location: [facility] ORS;  Service: General;  Laterality: Left;   REDUCTION MAMMAPLASTY     Social History   Socioeconomic History   Marital status: Married    Spouse name: Not on file   Number of children: Not on file   Years of education: Not on  file   Highest education level: Not on file  Occupational History   Not on file  Tobacco Use   Smoking status: Never   Smokeless tobacco: Never  Vaping Use   Vaping Use: Never used  Substance and Sexual  Activity   Alcohol use: Yes    Alcohol/week: 0.0 standard drinks    Comment: rare   Drug use: No   Sexual activity: Yes    Birth control/protection: Condom    Comment: 1st intercourse 50 yo-More than 5 partners  Other Topics Concern   Not on file  Social History Narrative   Not on file   Social Determinants of Health   Financial Resource Strain: Not on file  Food Insecurity: Not on file  Transportation Needs: Not on file  Physical Activity: Not on file  Stress: Not on file  Social Connections: Not on file  Intimate Partner Violence: Not on file   Family History  Problem Relation Age of Onset   Diabetes Father    Parkinsonism Father    Heart failure Father    Prostate cancer Father        dx 35s   Colon polyps Father    Turner syndrome Sister    Cancer Mother 66       pancreatic capsulated, found when took gallbaldder out   Colon polyps Mother    Breast cancer Neg Hx     Current Outpatient Medications:    cyclobenzaprine (FLEXERIL) 10 MG tablet, Take 10 mg by mouth 3 (three) times daily as needed for muscle spasms., Disp: , Rfl:    ibuprofen (ADVIL) 200 MG tablet, Take 400 mg by mouth every 6 (six) hours as needed., Disp: , Rfl:    Multiple Vitamin (MULTIVITAMIN WITH MINERALS) TABS tablet, Take 1 tablet by mouth daily., Disp: , Rfl:    tamoxifen (NOLVADEX) 20 MG tablet, TAKE 1 TABLET BY MOUTH EVERY DAY, Disp: 90 tablet, Rfl: 2   traZODone (DESYREL) 50 MG tablet, TAKE 1 TABLET BY MOUTH NIGHTLY AS NEEDED FOR SLEEP, Disp: , Rfl:   Physical exam:  Vitals:   07/01/21 0939  BP: 104/76  Pulse: 73  Resp: 16  Temp: (!) 97.5 F (36.4 C)  TempSrc: Tympanic  SpO2: 98%  Weight: 181 lb (82.1 kg)   Physical Exam Constitutional:      General: She is not in acute distress. Cardiovascular:     Rate and Rhythm: Normal rate and regular rhythm.     Heart sounds: Normal heart sounds.  Pulmonary:     Effort: Pulmonary effort is normal.     Breath sounds: Normal breath sounds.   Abdominal:     General: Bowel sounds are normal.     Palpations: Abdomen is soft.  Skin:    General: Skin is warm and dry.  Neurological:     Mental Status: She is alert and oriented to person, place, and time.  Breast exam: declined   CMP Latest Ref Rng & Units 06/27/2020  Glucose 70 - 99 mg/dL 100(H)  BUN 6 - 20 mg/dL 20  Creatinine 0.44 - 1.00 mg/dL 0.91  Sodium 135 - 145 mmol/L 136  Potassium 3.5 - 5.1 mmol/L 3.9  Chloride 98 - 111 mmol/L 105  CO2 22 - 32 mmol/L 24  Calcium 8.9 - 10.3 mg/dL 8.3(L)  Total Protein 6.5 - 8.1 g/dL 6.9  Total Bilirubin 0.3 - 1.2 mg/dL 0.6  Alkaline Phos 38 - 126 U/L 26(L)  AST 15 - 41 U/L 22  ALT  0 - 44 U/L 19   CBC Latest Ref Rng & Units 03/31/2019  WBC 3.8 - 10.8 Thousand/uL 6.3  Hemoglobin 11.7 - 15.5 g/dL 12.9  Hematocrit 35.0 - 45.0 % 39.3  Platelets 140 - 400 Thousand/uL 263   No images are attached to the encounter.  No results found.   Assessment and plan- Patient is a 50 y.o. 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 She will continue tamoxifen daily for 5 years or through 2026. Tolerating well. Plan for annual mammogram 12/11/20. Ordered today. Clinically, ned.   Insomnia- stable with use of trazodone.   Pre-menopausal- hormone levels in May 2021 were consistent with pre-menopausal state. Hormonal MIrena IUD was removed June 2021. Currently using condoms for birth control but could consider ParaGard/copper IUD as well. Can also consider rechecking hormone levels in the future to evaluate for menopause.   Around 12/11/21- mammogram 6 mo- see dr Janese Banks for surveillance of LCIS on tamoxifen- la   Visit Diagnosis 1. Breast neoplasm, Tis (LCIS), left   2. Encounter for monitoring tamoxifen therapy    Beckey Rutter, [place], AGNP-C [place] at [facility] [phone] (clinic) 07/01/2021

## 2021-08-27 ENCOUNTER — Other Ambulatory Visit: Payer: Self-pay

## 2021-08-27 ENCOUNTER — Ambulatory Visit (INDEPENDENT_AMBULATORY_CARE_PROVIDER_SITE_OTHER): Payer: BC Managed Care – PPO | Admitting: Obstetrics & Gynecology

## 2021-08-27 ENCOUNTER — Encounter: Payer: Self-pay | Admitting: Obstetrics & Gynecology

## 2021-08-27 ENCOUNTER — Other Ambulatory Visit (HOSPITAL_COMMUNITY)
Admission: RE | Admit: 2021-08-27 | Discharge: 2021-08-27 | Disposition: A | Discharge status: Home / Self Care | Payer: BC Managed Care – PPO | Source: Ambulatory Visit | Attending: Obstetrics & Gynecology | Admitting: Obstetrics & Gynecology

## 2021-08-27 VITALS — BP 104/62 | HR 99 | Resp 16 | Ht 62.25 in | Wt 180.0 lb

## 2021-08-27 DIAGNOSIS — Z01419 Encounter for gynecological examination (general) (routine) without abnormal findings: Secondary | ICD-10-CM | POA: Diagnosis present

## 2021-08-27 DIAGNOSIS — D0502 Lobular carcinoma in situ of left breast: Secondary | ICD-10-CM

## 2021-08-27 DIAGNOSIS — Z789 Other specified health status: Secondary | ICD-10-CM

## 2021-08-27 NOTE — Progress Notes (Signed)
Penny Acosta July 29, 1971 [id number]   History:    50 y.o. G2P2L2  RP:  Established patient presenting for annual gyn exam   HPI: Oligomenorrhea.  LMP 07/29/2021.  [place] level on 02/09/2020 was 5.9.  No pelvic pain.  No pain with IC.  Using condoms.  Pap smear Neg 03/2019.  No history of abnormal Pap smears. 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  Followed by oncology, determined that she does not need radiation.  On Tamoxifen x 1 year.  Mammo 11/2020 Benign.  BMI 32.66.  Colonoscopy 11/2019.  Health labs Fam MD at [facility].  Past medical history,surgical history, family history and social history were all reviewed and documented in the EPIC chart.  Gynecologic History Patient's last menstrual period was 07/29/2021 (exact date).  Obstetric History OB History  Gravida Para Term Preterm AB Living  2 2     0 2  SAB IAB Ectopic Multiple Live Births               # Outcome Date GA Lbr Len/2nd Weight Sex Delivery Anes PTL Lv  2 Para           1 Para              ROS: A ROS was performed and pertinent positives and negatives are included in the history.  GENERAL: No fevers or chills. HEENT: No change in vision, no earache, sore throat or sinus congestion. NECK: No pain or stiffness. CARDIOVASCULAR: No chest pain or pressure. No palpitations. PULMONARY: No shortness of breath, cough or wheeze. GASTROINTESTINAL: No abdominal pain, nausea, vomiting or diarrhea, melena or bright red blood per rectum. GENITOURINARY: No urinary frequency, urgency, hesitancy or dysuria. MUSCULOSKELETAL: No joint or muscle pain, no back pain, no recent trauma. DERMATOLOGIC: No rash, no itching, no lesions. ENDOCRINE: No polyuria, polydipsia, no heat or cold intolerance. No recent change in weight. HEMATOLOGICAL: No anemia or easy bruising or bleeding. NEUROLOGIC: No headache, seizures, numbness, tingling or weakness. PSYCHIATRIC: No depression, no loss of interest in normal activity or change in sleep pattern.      Exam:   BP 104/62    Pulse 99    Resp 16    Ht 5' 2.25" (1.581 m)    Wt 180 lb (81.6 kg)    LMP 07/29/2021 (Exact Date)    BMI 32.66 kg/m   Body mass index is 32.66 kg/m.  General appearance : Well developed well nourished female. No acute distress HEENT: Eyes: no retinal hemorrhage or exudates,  Neck supple, trachea midline, no carotid bruits, no thyroidmegaly Lungs: Clear to auscultation, no rhonchi or wheezes, or rib retractions  Heart: Regular rate and rhythm, no murmurs or gallops Breast:Examined in sitting and supine position were symmetrical in appearance, no palpable masses or tenderness,  no skin retraction, no nipple inversion, no nipple discharge, no skin discoloration, no axillary or supraclavicular lymphadenopathy Abdomen: no palpable masses or tenderness, no rebound or guarding Extremities: no edema or skin discoloration or tenderness  Pelvic: Vulva: Normal             Vagina: No gross lesions or discharge  Cervix: No gross lesions or discharge.  Pap reflex done.  Uterus  AV, normal size, shape and consistency, non-tender and mobile  Adnexa  Without masses or tenderness  Anus: Normal   Assessment/Plan:  50 y.o. female for annual exam   1. Encounter for routine gynecological examination with Papanicolaou smear of cervix Oligomenorrhea.  LMP 07/29/2021.  [place]  level on 02/09/2020 was 5.9.  No pelvic pain.  No pain with IC.  Using condoms.  Pap smear Neg 03/2019.  No history of abnormal Pap smears. 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  Followed by oncology, determined that she does not need radiation.  On Tamoxifen x 1 year.  Mammo 11/2020 Benign.  BMI 32.66.  Colonoscopy 11/2019.  Health labs Fam MD at [facility]. - Cytology - PAP( [place])  2. Use of condoms for contraception  3. Breast neoplasm, Tis (LCIS), left  Left breast LCIS status post left lumpectomy.  Followed by oncology, determined that she does not need radiation. On Tamoxifen x 1 year.  Dx Mammo/US  scheduled for 11/2021.  Princess Bruins MD, 1:43 PM 08/27/2021

## 2021-08-30 ENCOUNTER — Encounter: Payer: Self-pay | Admitting: Obstetrics & Gynecology

## 2021-08-30 LAB — CYTOLOGY - PAP
Comment: NEGATIVE
Diagnosis: NEGATIVE
High risk HPV: NEGATIVE

## 2021-11-16 ENCOUNTER — Other Ambulatory Visit: Payer: Self-pay | Admitting: Oncology

## 2021-12-11 ENCOUNTER — Ambulatory Visit
Admission: RE | Admit: 2021-12-11 | Discharge: 2021-12-11 | Disposition: A | Discharge status: Home / Self Care | Payer: BC Managed Care – PPO | Source: Ambulatory Visit | Attending: Nurse Practitioner | Admitting: Nurse Practitioner

## 2021-12-11 ENCOUNTER — Other Ambulatory Visit: Payer: Self-pay

## 2021-12-11 DIAGNOSIS — D0502 Lobular carcinoma in situ of left breast: Secondary | ICD-10-CM | POA: Diagnosis not present

## 2021-12-30 ENCOUNTER — Encounter: Payer: Self-pay | Admitting: Oncology

## 2021-12-30 ENCOUNTER — Inpatient Hospital Stay: Payer: BC Managed Care – PPO | Attending: Oncology | Admitting: Oncology

## 2021-12-30 VITALS — BP 96/58 | HR 80 | Temp 96.6°F | Resp 18 | Wt 181.1 lb

## 2021-12-30 DIAGNOSIS — Z800 Family history of malignant neoplasm of digestive organs: Secondary | ICD-10-CM | POA: Insufficient documentation

## 2021-12-30 DIAGNOSIS — Z79810 Long term (current) use of selective estrogen receptor modulators (SERMs): Secondary | ICD-10-CM | POA: Insufficient documentation

## 2021-12-30 DIAGNOSIS — Z8042 Family history of malignant neoplasm of prostate: Secondary | ICD-10-CM | POA: Insufficient documentation

## 2021-12-30 DIAGNOSIS — D0502 Lobular carcinoma in situ of left breast: Secondary | ICD-10-CM | POA: Diagnosis present

## 2021-12-30 NOTE — Progress Notes (Signed)
? ? ? ?Hematology/Oncology Consult note ?[place]  ?[phone] [phone] ? ?Patient Care Team: ?Dion Body, MD as PCP - General (Family Medicine) ?Sindy Guadeloupe, MD as Consulting Physician (Oncology) ?Herbert Pun, MD as Consulting Physician (General Surgery) ?Theodore Demark, RN as Registered Nurse  ? ?Name of the patient: Penny Acosta  ?[id number]  ?August 31, 1971  ? ?Date of visit: 12/30/21 ? 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 ?Chief complaint/ Reason for visit-routine follow-up of LCIS on tamoxifen for chemoprevention ? ?Heme/Onc history: Patient is a 51 year old female who recently underwent a screening mammogram on 12/08/2019 which showed left breast calcifications.  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  biopsy specimen showed mixed features of dcis and lcis.  ?She had b/l breast MRI which showed lobular mass in the right breast and possible complex cyst in the left breast. Left breast cyst was a benign sebaceous cyst. Right breast biopsy showed  FIBROCYSTIC CHANGE WITH APOCRINE METAPLASIA, ?PSEUDOANGIOMATOUS STROMAL HYPERPLASIA.  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  ER greater than 90% positive ? ?Patient is premenopausal and was started on tamoxifen for chemoprevention in May 2021 ? ?Interval history-tolerating tamoxifen well without any significant side effects.  She still gets her menstrual cycles almost every other month at this time.  Denies any breast concerns ? ?ECOG PS- 0 ?Pain scale- 0 ? ?Review of systems- Review of Systems  ?Constitutional:  Negative for chills, fever, malaise/fatigue and weight loss.  ?HENT:  Negative for congestion, ear discharge and nosebleeds.   ?Eyes:  Negative for blurred vision.  ?Respiratory:   Negative for cough, hemoptysis, sputum production, shortness of breath and wheezing.   ?Cardiovascular:  Negative for chest pain, palpitations, orthopnea and claudication.  ?Gastrointestinal:  Negative for abdominal pain, blood in stool, constipation, diarrhea, heartburn, melena, nausea and vomiting.  ?Genitourinary:  Negative for dysuria, flank pain, frequency, hematuria and urgency.  ?Musculoskeletal:  Negative for back pain, joint pain and myalgias.  ?Skin:  Negative for rash.  ?Neurological:  Negative for dizziness, tingling, focal weakness, seizures, weakness and headaches.  ?Endo/Heme/Allergies:  Does not bruise/bleed easily.  ?Psychiatric/Behavioral:  Negative for depression and suicidal ideas. The patient does not have insomnia.    ? ? ? ?No Known Allergies ? ? ?Past Medical History:  ?Diagnosis Date  ? Anxiety   ? Breast cancer ([place])   ? Cancer [facility])   ? Complication of anesthesia   ? GERD (gastroesophageal reflux disease)   ? occ  ? IUD 02/2016  ? MIRENA INSERTED 11/2005, 10/2010. 02/2016  ? PONV (postoperative nausea and vomiting)   ? ? ? ?Past Surgical History:  ?Procedure Laterality Date  ? BREAST BIOPSY Left 01/03/2020  ? calcs UOQ, ribbon marker, path pending  ? BREAST LUMPECTOMY    ? CESAREAN SECTION  2007  ? TWINS  ? INTRAUTERINE DEVICE INSERTION  02/2016  ? mirena  ? PART MASTECTOMY,RADIO FREQUENCY LOCALIZER,AXILLARY SENTINEL NODE BIOPSY Left 01/20/2020  ? Procedure: PARTIAL MASTECTOMY WITH Radio Frequency AND AXILLARY SENTINEL LYMPH NODE BX;  Surgeon: Herbert Pun, MD;  Location: [facility] ORS;  Service: General;  Laterality: Left;  ? REDUCTION MAMMAPLASTY    ? ? ?Social History  ? ?Socioeconomic History  ? Marital status: Married  ?  Spouse name: Not on file  ? Number of children: Not  on file  ? Years of education: Not on file  ? Highest education level: Not on file  ?Occupational History  ? Not on file  ?Tobacco Use  ? Smoking status: Never  ? Smokeless tobacco: Never  ?Vaping Use  ? Vaping Use:  Never used  ?Substance and Sexual Activity  ? Alcohol use: Not Currently  ? Drug use: No  ? Sexual activity: Yes  ?  Partners: Male  ?  Birth control/protection: Condom  ?  Comment: 1st intercourse 51 yo-More than 5 partners  ?Other Topics Concern  ? Not on file  ?Social History Narrative  ? Not on file  ? ?Social Determinants of Health  ? ?Financial Resource Strain: Not on file  ?Food Insecurity: Not on file  ?Transportation Needs: Not on file  ?Physical Activity: Not on file  ?Stress: Not on file  ?Social Connections: Not on file  ?Intimate Partner Violence: Not on file  ? ? ?Family History  ?Problem Relation Age of Onset  ? Diabetes Father   ? Parkinsonism Father   ? Heart failure Father   ? Prostate cancer Father   ?     dx 76s  ? Colon polyps Father   ? Turner syndrome Sister   ? Cancer Mother 6  ?     pancreatic capsulated, found when took gallbaldder out  ? Colon polyps Mother   ? Breast cancer Neg Hx   ? ? ? ?Current Outpatient Medications:  ?  cyclobenzaprine (FLEXERIL) 10 MG tablet, Take 10 mg by mouth 3 (three) times daily as needed for muscle spasms., Disp: , Rfl:  ?  ibuprofen (ADVIL) 200 MG tablet, Take 400 mg by mouth every 6 (six) hours as needed., Disp: , Rfl:  ?  Semaglutide,0.25 or 0.'5MG'$ /DOS, 2 MG/1.5ML SOPN, Inject into the skin., Disp: , Rfl:  ?  tamoxifen (NOLVADEX) 20 MG tablet, TAKE 1 TABLET BY MOUTH EVERY DAY, Disp: 90 tablet, Rfl: 2 ?  traZODone (DESYREL) 50 MG tablet, TAKE 1 TABLET BY MOUTH NIGHTLY AS NEEDED FOR SLEEP, Disp: , Rfl:  ?  escitalopram (LEXAPRO) 5 MG tablet, Take by mouth. (Patient not taking: Reported on 12/30/2021), Disp: , Rfl:  ? ?Physical exam:  ?Vitals:  ? 12/30/21 1056  ?BP: (!) 96/58  ?Pulse: 80  ?Resp: 18  ?Temp: (!) 96.6 ?F (35.9 ?C)  ?SpO2: 100%  ?Weight: 181 lb 1.6 oz (82.1 kg)  ? ?Physical Exam ?Constitutional:   ?   General: She is not in acute distress. ?Cardiovascular:  ?   Rate and Rhythm: Normal rate and regular rhythm.  ?   Heart sounds: Normal heart sounds.   ?Pulmonary:  ?   Effort: Pulmonary effort is normal.  ?   Breath sounds: Normal breath sounds.  ?Abdominal:  ?   General: Bowel sounds are normal.  ?   Palpations: Abdomen is soft.  ?Skin: ?   General: Skin is warm and dry.  ?Neurological:  ?   Mental Status: She is alert and oriented to person, place, and time.  ? Breast exam was performed in seated and lying down position. ?Patient is status post left lumpectomy with a well-healed surgical scar. No evidence of any palpable masses. No evidence of axillary adenopathy. No evidence of any palpable masses or lumps in the right breast. No evidence of right axillary adenopathy ? ? ? ?  Latest Ref Rng & Units 06/27/2020  ?  2:40 PM  ?CMP  ?Glucose 70 - 99 mg/dL 100    ?  BUN 6 - 20 mg/dL 20    ?Creatinine 0.44 - 1.00 mg/dL 0.91    ?Sodium 135 - 145 mmol/L 136    ?Potassium 3.5 - 5.1 mmol/L 3.9    ?Chloride 98 - 111 mmol/L 105    ?CO2 22 - 32 mmol/L 24    ?Calcium 8.9 - 10.3 mg/dL 8.3    ?Total Protein 6.5 - 8.1 g/dL 6.9    ?Total Bilirubin 0.3 - 1.2 mg/dL 0.6    ?Alkaline Phos 38 - 126 U/L 26    ?AST 15 - 41 U/L 22    ?ALT 0 - 44 U/L 19    ? ? ?  Latest Ref Rng & Units 03/31/2019  ?  4:32 PM  ?CBC  ?WBC 3.8 - 10.8 Thousand/uL 6.3    ?Hemoglobin 11.7 - 15.5 g/dL 12.9    ?Hematocrit 35.0 - 45.0 % 39.3    ?Platelets 140 - 400 Thousand/uL 263    ? ? ?No images are attached to the encounter. ? ?MM DIAG BREAST TOMO BILATERAL ? ?Result Date: 12/11/2021 ?CLINICAL DATA:  LEFT lumpectomy May 2021 EXAM: DIGITAL DIAGNOSTIC BILATERAL MAMMOGRAM WITH TOMOSYNTHESIS AND CAD TECHNIQUE: Bilateral digital diagnostic mammography and breast tomosynthesis was performed. The images were evaluated with computer-aided detection. COMPARISON:  Previous exam(s). ACR Breast Density Category b: There are scattered areas of fibroglandular density. FINDINGS: There is density and architectural distortion within the LEFT breast, consistent with postsurgical changes. These are stable in comparison to prior. No  suspicious mass, distortion, or microcalcifications are identified to suggest presence of malignancy. IMPRESSION: No mammographic evidence of malignancy bilaterally. RECOMMENDATION: Recommend bila

## 2021-12-30 NOTE — Progress Notes (Signed)
Pt states she requested a refill for FLEXERIL and request denied and not sure why. ?

## 2022-02-02 IMAGING — MG DIGITAL SCREENING BILAT W/ TOMO W/ CAD
6 of 9 series · 6 of 25 positions shown · non-contrast
Comparison: Previous exam(s).

CLINICAL DATA: Screening.

EXAM:
DIGITAL SCREENING BILATERAL MAMMOGRAM WITH TOMO AND CAD

[R CC]
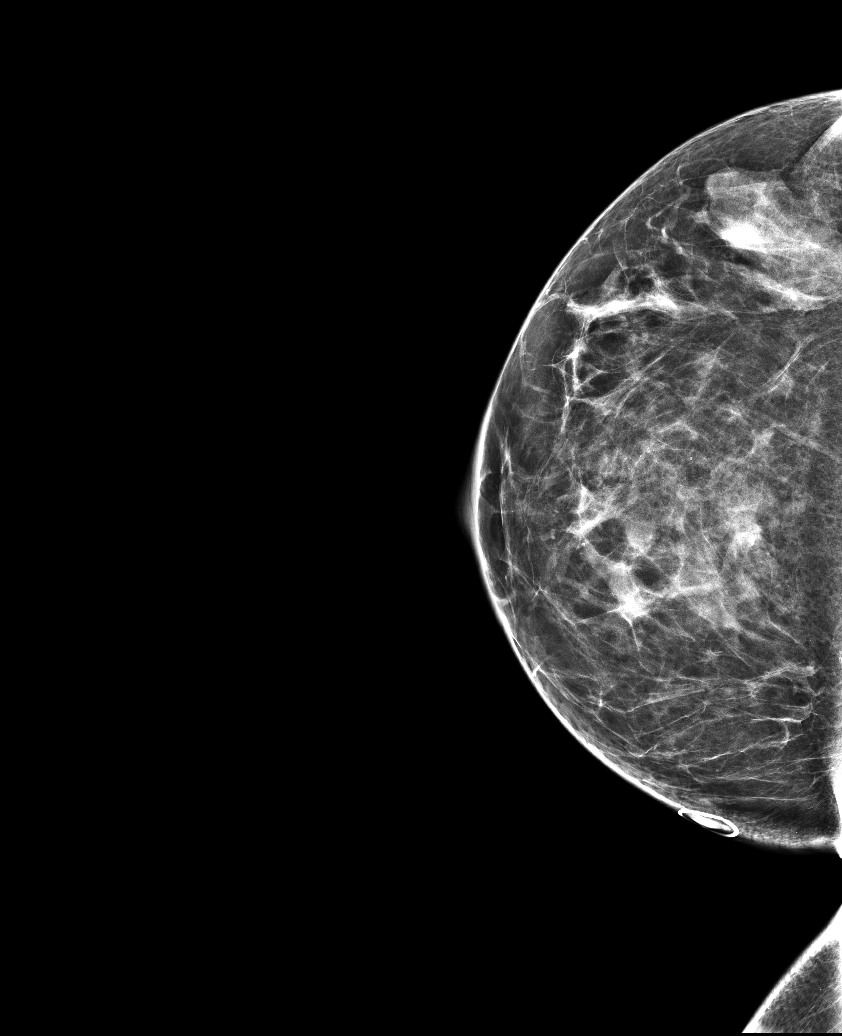

[R CC synth-2D]
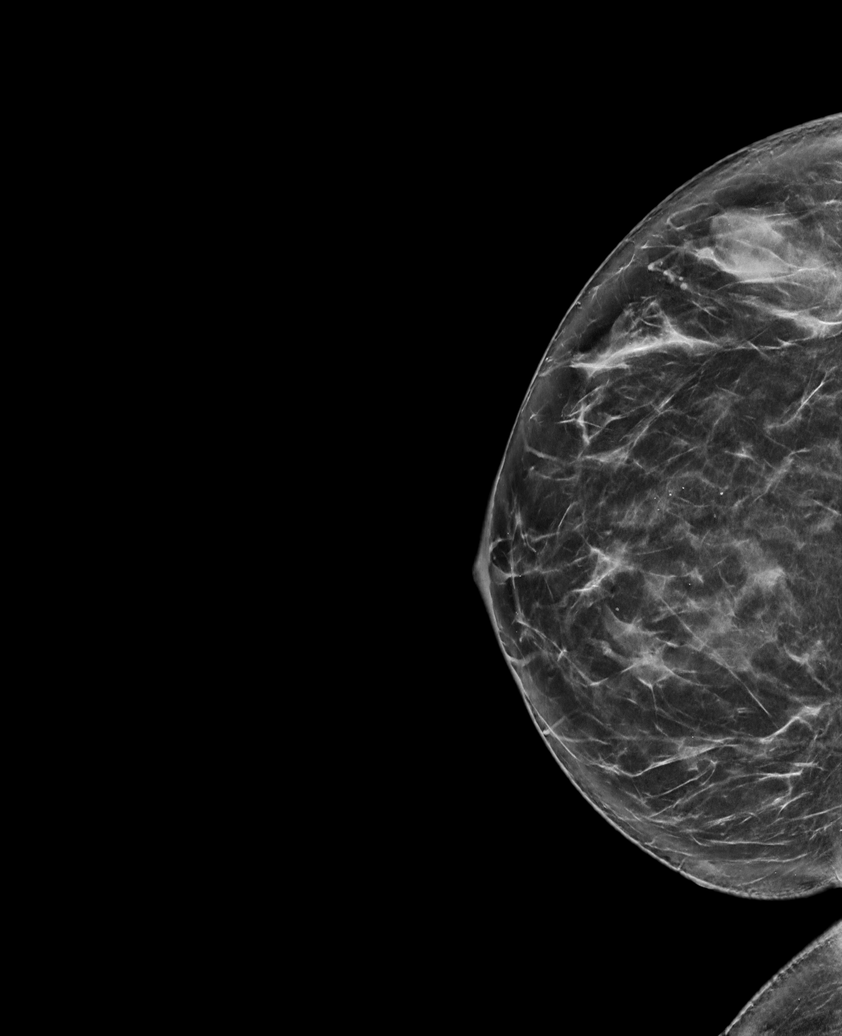

[L MLO synth-2D]
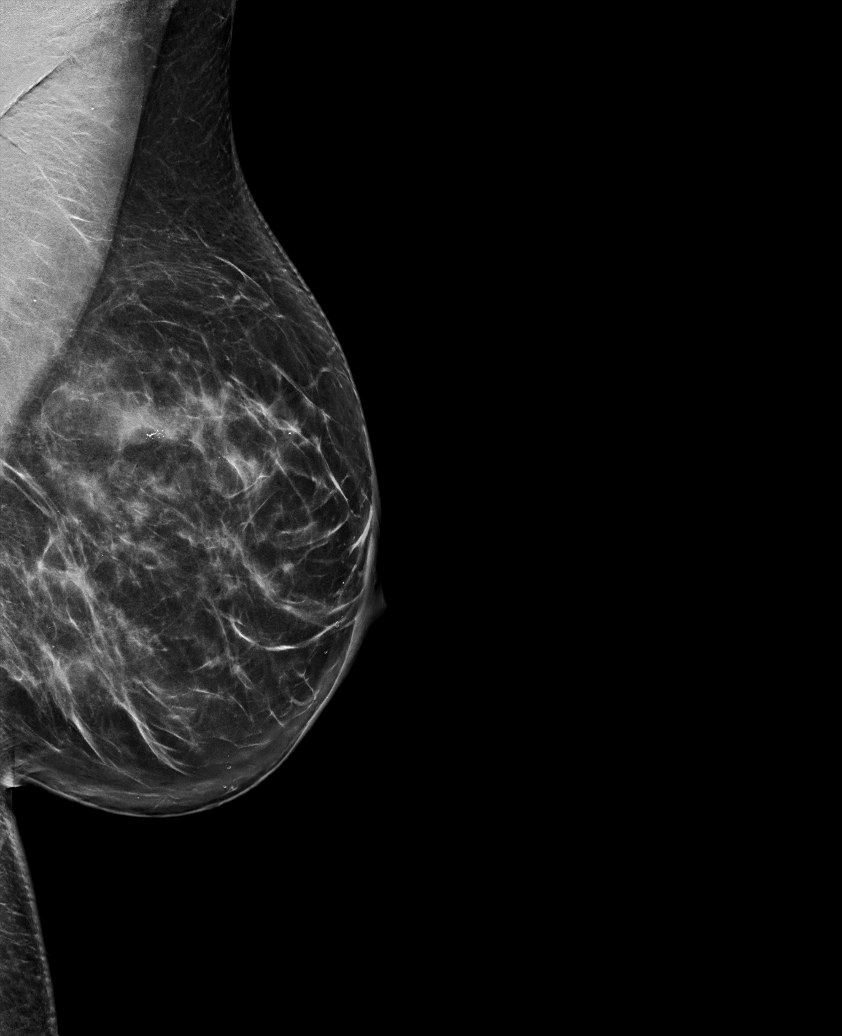

[L CC synth-2D]
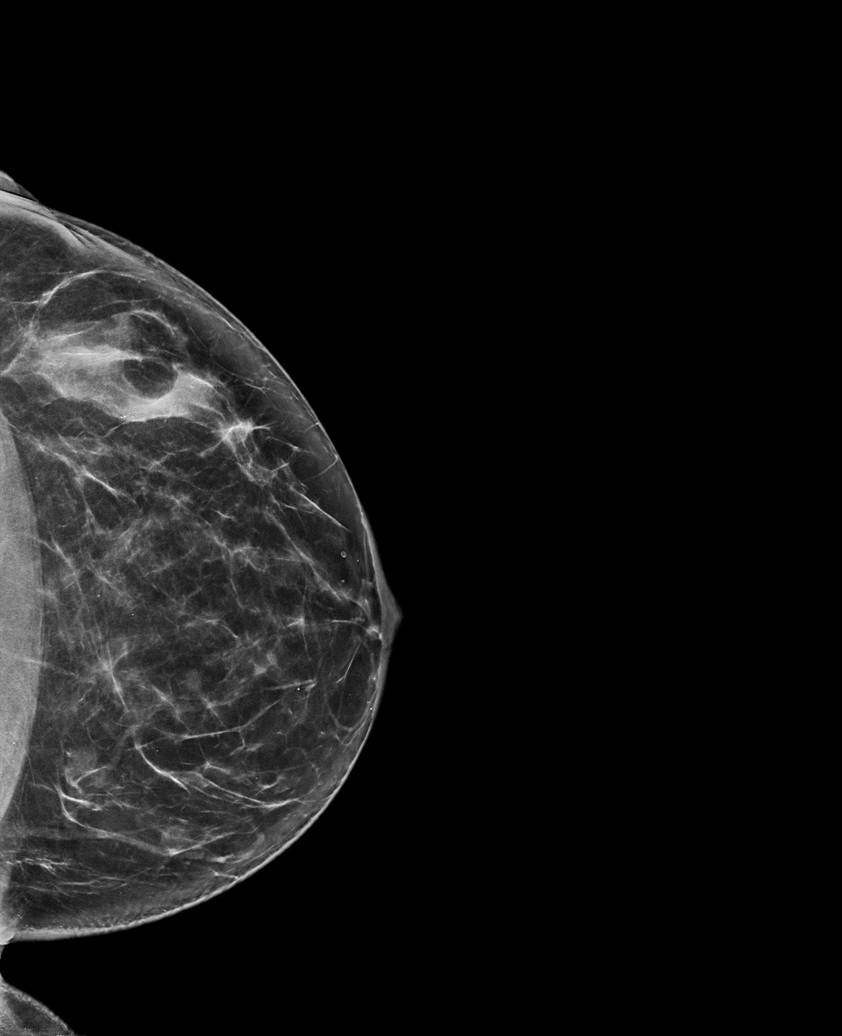

[R MLO synth-2D]
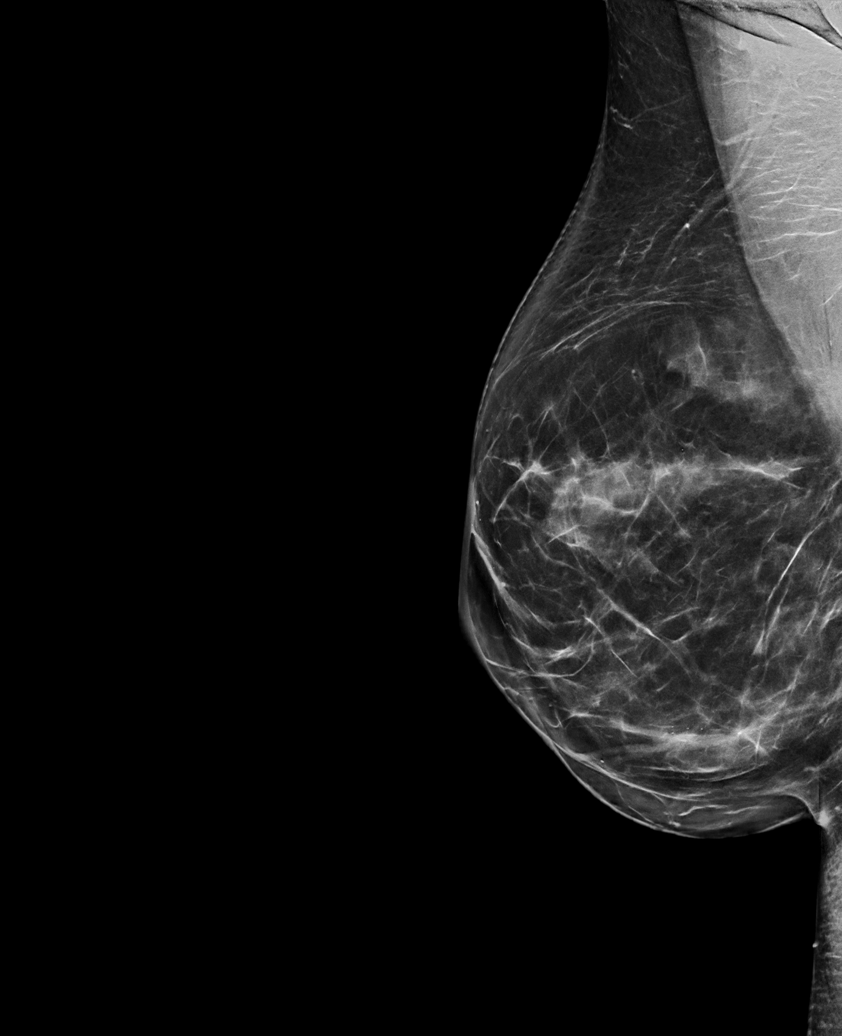

[R MLO tomo · tomo slice 43/86.0]
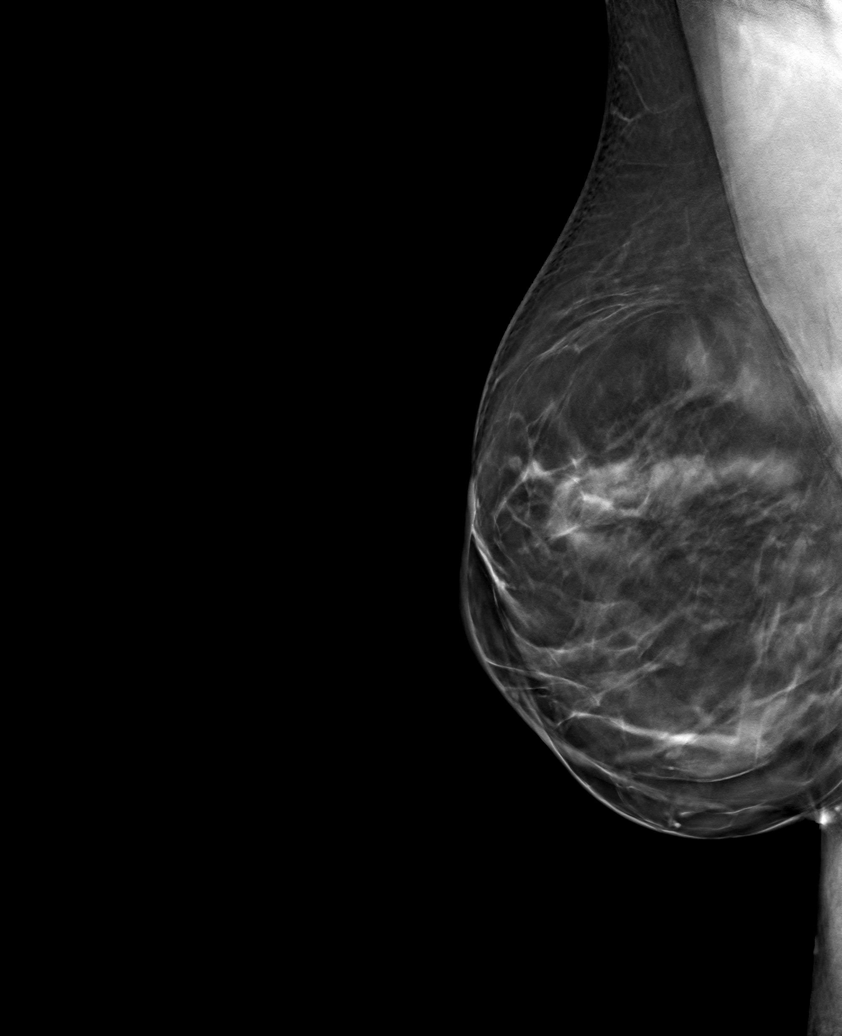

[6 of 25 positions shown; findings below may reference images not displayed]

ACR Breast Density Category b: There are scattered areas of
fibroglandular density.
FINDINGS: In the left breast, calcifications warrant further evaluation. In
the right breast, no findings suspicious for malignancy. Images were
processed with CAD.
IMPRESSION: Further evaluation is suggested for calcifications in the left
breast.

RECOMMENDATION:
Diagnostic mammogram of the left breast. (Code:8D-N-GGC)

The patient will be contacted regarding the findings, and additional
imaging will be scheduled.

BI-RADS CATEGORY  0: Incomplete. Need additional imaging evaluation
and/or prior mammograms for comparison.

## 2022-05-30 ENCOUNTER — Emergency Department
Admission: EM | Admit: 2022-05-30 | Discharge: 2022-05-30 | Disposition: A | Discharge status: Home / Self Care | Payer: BC Managed Care – PPO | Attending: Student in an Organized Health Care Education/Training Program | Admitting: Student in an Organized Health Care Education/Training Program

## 2022-05-30 ENCOUNTER — Other Ambulatory Visit: Payer: Self-pay

## 2022-05-30 ENCOUNTER — Emergency Department: Payer: BC Managed Care – PPO

## 2022-05-30 ENCOUNTER — Encounter: Payer: Self-pay | Admitting: Intensive Care

## 2022-05-30 DIAGNOSIS — S0990XA Unspecified injury of head, initial encounter: Secondary | ICD-10-CM | POA: Diagnosis present

## 2022-05-30 DIAGNOSIS — S0211HA Other fracture of occiput, left side, initial encounter for closed fracture: Secondary | ICD-10-CM | POA: Insufficient documentation

## 2022-05-30 DIAGNOSIS — W19XXXA Unspecified fall, initial encounter: Secondary | ICD-10-CM

## 2022-05-30 DIAGNOSIS — I609 Nontraumatic subarachnoid hemorrhage, unspecified: Secondary | ICD-10-CM

## 2022-05-30 DIAGNOSIS — Z853 Personal history of malignant neoplasm of breast: Secondary | ICD-10-CM | POA: Diagnosis not present

## 2022-05-30 DIAGNOSIS — M542 Cervicalgia: Secondary | ICD-10-CM | POA: Diagnosis not present

## 2022-05-30 DIAGNOSIS — W06XXXA Fall from bed, initial encounter: Secondary | ICD-10-CM | POA: Diagnosis not present

## 2022-05-30 DIAGNOSIS — S060X0A Concussion without loss of consciousness, initial encounter: Secondary | ICD-10-CM

## 2022-05-30 DIAGNOSIS — S02119A Unspecified fracture of occiput, initial encounter for closed fracture: Secondary | ICD-10-CM

## 2022-05-30 LAB — CBC
HCT: 36.8 % (ref 36.0–46.0)
Hemoglobin: 11.9 g/dL — ABNORMAL LOW (ref 12.0–15.0)
MCH: 29 pg (ref 26.0–34.0)
MCHC: 32.3 g/dL (ref 30.0–36.0)
MCV: 89.8 fL (ref 80.0–100.0)
Platelets: 202 10*3/uL (ref 150–400)
RBC: 4.1 MIL/uL (ref 3.87–5.11)
RDW: 12.5 % (ref 11.5–15.5)
WBC: 5.6 10*3/uL (ref 4.0–10.5)
nRBC: 0 % (ref 0.0–0.2)

## 2022-05-30 LAB — URINALYSIS, ROUTINE W REFLEX MICROSCOPIC
Bilirubin Urine: NEGATIVE
Glucose, UA: NEGATIVE mg/dL
Hgb urine dipstick: NEGATIVE
Ketones, ur: 20 mg/dL — AB
Leukocytes,Ua: NEGATIVE
Nitrite: NEGATIVE
Protein, ur: NEGATIVE mg/dL
Specific Gravity, Urine: 1.019 (ref 1.005–1.030)
pH: 5 (ref 5.0–8.0)

## 2022-05-30 LAB — BASIC METABOLIC PANEL
Anion gap: 7 (ref 5–15)
BUN: 14 mg/dL (ref 6–20)
CO2: 20 mmol/L — ABNORMAL LOW (ref 22–32)
Calcium: 8.6 mg/dL — ABNORMAL LOW (ref 8.9–10.3)
Chloride: 113 mmol/L — ABNORMAL HIGH (ref 98–111)
Creatinine, Ser: 0.93 mg/dL (ref 0.44–1.00)
GFR, Estimated: >60 mL/min (ref >60)
Glucose, Bld: 119 mg/dL — ABNORMAL HIGH (ref 70–99)
Potassium: 4 mmol/L (ref 3.5–5.1)
Sodium: 140 mmol/L (ref 135–145)

## 2022-05-30 MED ORDER — SODIUM CHLORIDE 0.9 % IV BOLUS
1000.0000 mL | Freq: Once | INTRAVENOUS | Status: AC
  Administered 2022-05-30: 1000 mL via INTRAVENOUS

## 2022-05-30 MED ORDER — ONDANSETRON 4 MG PO TBDP
4.0000 mg | ORAL_TABLET | Freq: Three times a day (TID) | ORAL | 0 refills | Status: DC | PRN
Start: 2022-05-30 — End: 2022-07-01

## 2022-05-30 MED ORDER — ONDANSETRON HCL 4 MG/2ML IJ SOLN
4.0000 mg | Freq: Once | INTRAMUSCULAR | Status: AC
Start: 2022-05-30 — End: 2022-05-30
  Administered 2022-05-30: 4 mg via INTRAVENOUS
  Filled 2022-05-30: qty 2

## 2022-05-30 MED ORDER — CYCLOBENZAPRINE HCL 5 MG PO TABS: 5.0000 mg | ORAL_TABLET | Freq: Three times a day (TID) | ORAL | 0 refills | Status: DC | PRN

## 2022-05-30 MED ORDER — ACETAMINOPHEN 325 MG PO TABS
650.0000 mg | ORAL_TABLET | Freq: Once | ORAL | Status: AC
  Administered 2022-05-30: 650 mg via ORAL
  Filled 2022-05-30: qty 2

## 2022-05-30 MED ORDER — FENTANYL CITRATE PF 50 MCG/ML IJ SOSY
50.0000 ug | PREFILLED_SYRINGE | INTRAMUSCULAR | Status: DC | PRN
Start: 2022-05-30 — End: 2022-05-30
  Administered 2022-05-30: 50 ug via INTRAVENOUS
  Filled 2022-05-30: qty 1

## 2022-05-30 MED ORDER — OXYCODONE-ACETAMINOPHEN 5-325 MG PO TABS: 1.0000 | ORAL_TABLET | ORAL | 0 refills | Status: DC | PRN

## 2022-05-30 MED ORDER — HYDROMORPHONE HCL 1 MG/ML IJ SOLN
0.5000 mg | INTRAMUSCULAR | Status: DC | PRN
  Administered 2022-05-30: 0.5 mg via INTRAVENOUS
  Filled 2022-05-30: qty 0.5

## 2022-05-30 NOTE — ED Notes (Signed)
Pt resting in bed. Denies new HA, vision changes or any new neuro symptoms. Warm blankets provided and sips of apple juice, approved by EDP.

## 2022-05-30 NOTE — ED Notes (Signed)
Helped pt to toilet. 

## 2022-05-30 NOTE — ED Triage Notes (Addendum)
Patient reports having syncope episode this AM around 6am and falling and hitting head. C/o dizziness and nausea. Denies taking blood thinners. A&O x4 at this time. Pt c/o neck pain

## 2022-05-30 NOTE — ED Notes (Signed)
Pt resting in bed, warm blankets on pt, repeat head CT this afternoon, lights are dimmed, pt taking small sips of apple juice.

## 2022-05-30 NOTE — ED Provider Notes (Signed)
[facility] Provider Note    Event Date/Time   First MD Initiated Contact with Patient 05/30/22 [phone]     (approximate)   History   Fall   HPI  Penny Acosta is a 51 y.o. female with a history of breast cancer on tamoxifen on blood thinners presents to the ER for evaluation of head injury that occurred this morning.  She states that she will frequently get lightheaded from time to time particularly in the morning if she stands up too quickly.  States that she typically takes her time getting out of bed but this morning was in a hurry got up felt lightheaded fell and hit the ground.  Husband was in the room was awoken by her falling and hitting the ground.  She hit the back of her head is complaining of some neck discomfort.  No other pain.  Reportedly was bit confused after the injury.  She was able to ambulate to the car and into the ER.  No reported seizure-like activity no numbness or tingling.  Does have mild headache.  No blurry vision.     Physical Exam   Triage Vital Signs: ED Triage Vitals  Enc Vitals Group     BP 05/30/22 0738 (!) 105/55     Pulse Rate 05/30/22 0738 76     Resp 05/30/22 0738 14     Temp 05/30/22 0738 97.7 F (36.5 C)     Temp Source 05/30/22 0738 Oral     SpO2 05/30/22 0738 97 %     Weight 05/30/22 0734 152 lb (68.9 kg)     Height 05/30/22 0734 '5\' 2"'$  (1.575 m)     Head Circumference --      Peak Flow --      Pain Score 05/30/22 0734 6     Pain Loc --      Pain Edu? --      Excl. in [place]? --     Most recent vital signs: Vitals:   05/30/22 1130 05/30/22 1200  BP: (!) 96/50 (!) 92/53  Pulse: 84 79  Resp: (!) 21 15  Temp:    SpO2: 100% 100%     Constitutional: Alert  Eyes: Conjunctivae are normal.  Head: Atraumatic. Nose: No congestion/rhinnorhea. Mouth/Throat: Mucous membranes are moist.   Neck: Painless ROM.  Cardiovascular:   Good peripheral circulation. Respiratory: Normal respiratory effort.  No  retractions.  Gastrointestinal: Soft and nontender.  Musculoskeletal:  no deformity Neurologic:  MAE spontaneously. No gross focal neurologic deficits are appreciated.  Skin:  Skin is warm, dry and intact. No rash noted. Psychiatric: Mood and affect are normal. Speech and behavior are normal.    ED Results / Procedures / Treatments   Labs (all labs ordered are listed, but only abnormal results are displayed) Labs Reviewed  BASIC METABOLIC PANEL - Abnormal; Notable for the following components:      Result Value   Chloride 113 (*)    CO2 20 (*)    Glucose, Bld 119 (*)    Calcium 8.6 (*)    All other components within normal limits  CBC - Abnormal; Notable for the following components:   Hemoglobin 11.9 (*)    All other components within normal limits  URINALYSIS, ROUTINE W REFLEX MICROSCOPIC - Abnormal; Notable for the following components:   Color, Urine YELLOW (*)    APPearance HAZY (*)    Ketones, ur 20 (*)    All other components within normal limits  EKG  ED ECG REPORT I, Merlyn Lot, the attending physician, personally viewed and interpreted this ECG.   Date: 05/30/2022  EKG Time: 7:44  Rate: 80  Rhythm: sinus  Axis: normal  Intervals:normal  ST&T Change: no stemi, no depressions    RADIOLOGY Please see ED Course for my review and interpretation.  I personally reviewed all radiographic images ordered to evaluate for the above acute complaints and reviewed radiology reports and findings.  These findings were personally discussed with the patient.  Please see medical record for radiology report.    PROCEDURES:  Critical Care performed: Yes, see critical care procedure note(s)  .Critical Care  Performed by: Merlyn Lot, MD Authorized by: Merlyn Lot, MD   Critical care provider statement:    Critical care time (minutes):  35   Critical care was necessary to treat or prevent imminent or life-threatening deterioration of the  following conditions:  CNS failure or compromise and trauma   Critical care was time spent personally by me on the following activities:  Ordering and performing treatments and interventions, ordering and review of laboratory studies, ordering and review of radiographic studies, pulse oximetry, re-evaluation of patient's condition, review of old charts, obtaining history from patient or surrogate, examination of patient, evaluation of patient's response to treatment, discussions with primary provider, discussions with consultants and development of treatment plan with patient or surrogate    MEDICATIONS ORDERED IN ED: Medications  fentaNYL (SUBLIMAZE) injection 50 mcg (50 mcg Intravenous Given 05/30/22 0837)  HYDROmorphone (DILAUDID) injection 0.5 mg (0.5 mg Intravenous Given 05/30/22 1226)  acetaminophen (TYLENOL) tablet 650 mg (650 mg Oral Given 05/30/22 0837)  sodium chloride 0.9 % bolus 1,000 mL (0 mLs Intravenous Stopped 05/30/22 1214)  ondansetron (ZOFRAN) injection 4 mg (4 mg Intravenous Given 05/30/22 1225)     IMPRESSION / MDM / [place] / ED COURSE  I reviewed the triage vital signs and the nursing notes.                              Differential diagnosis includes, but is not limited to, SDH, IPH, concussion, electrolyte abnormality, dehydration, dysrhythmia, orthostasis  Patient presented to the ER for evaluation of symptoms as described above.  Patient clinically well-appearing but given mechanism of injury episode of confusion after the fall CT imaging ordered for the above differential.  This presenting complaint could reflect a potentially life-threatening illness therefore the patient will be placed on continuous pulse oximetry and telemetry for monitoring.  Laboratory evaluation will be sent to evaluate for the above complaints.      Clinical Course as of 05/30/22 1514  Fri May 30, 2022  0160 CT imaging on my review and interpretation concerning for [facility].  Formal  radiology report. [PR]  0830 Patient remains clinically nontoxic.  I discussed the case in consultation with Dr. Lacinda Axon of neurosurgery who does recommend repeat CT imaging in 6 hours to evaluate for stability.  I have ordered IV fentanyl for pain given her fracture. [PR]  1093 CT imaging repeated and is stable.  Patient remains well-appearing in no acute distress.  Discussed conservative management signs and symptoms for which she should return.  Patient agreeable to plan.  Her blood pressure has been soft but her maps have been greater than 65.  On review she does have a long history of low blood pressure and states it runs in her family she is not symptomatic.  Does appear  appropriate for outpatient follow-up. [PR]    Clinical Course User Index [PR] Merlyn Lot, MD     FINAL CLINICAL IMPRESSION(S) / ED DIAGNOSES   Final diagnoses:  Closed fracture of left side of occipital bone, unspecified occipital fracture type, initial encounter [facility])  Concussion without loss of consciousness, initial encounter  SAH (subarachnoid hemorrhage) ([place])     Rx / DC Orders   ED Discharge Orders          Ordered    oxyCODONE-acetaminophen (PERCOCET) 5-325 MG tablet  Every 4 hours PRN        05/30/22 1507    ondansetron (ZOFRAN-ODT) 4 MG disintegrating tablet  Every 8 hours PRN        05/30/22 1507    cyclobenzaprine (FLEXERIL) 5 MG tablet  3 times daily PRN        05/30/22 1507             Note:  This document was prepared using Dragon voice recognition software and may include unintentional dictation errors.    Merlyn Lot, MD 05/30/22 [phone]

## 2022-05-30 NOTE — ED Notes (Signed)
EDP at bedside.  Pt has hx orthostasis, states this AM got up without waiting to stand and felt dizzy, then woke up on floor with husband at side. Husband states she asked several times "what happened?". Pt states posterior area of head is sore. Denies blood thinners.

## 2022-05-30 NOTE — ED Notes (Signed)
Pt states feels very nauseous and asked for emesis bag. Handed to pt. EDP informed. No other neuro changes noted at this time.

## 2022-06-03 ENCOUNTER — Ambulatory Visit: Payer: BC Managed Care – PPO | Admitting: Sports Medicine

## 2022-06-05 ENCOUNTER — Ambulatory Visit (INDEPENDENT_AMBULATORY_CARE_PROVIDER_SITE_OTHER): Payer: BC Managed Care – PPO

## 2022-06-05 ENCOUNTER — Ambulatory Visit: Payer: BC Managed Care – PPO | Admitting: Sports Medicine

## 2022-06-05 VITALS — BP 118/76 | HR 83 | Ht 62.0 in | Wt 153.0 lb

## 2022-06-05 DIAGNOSIS — S02119A Unspecified fracture of occiput, initial encounter for closed fracture: Secondary | ICD-10-CM

## 2022-06-05 DIAGNOSIS — M545 Low back pain, unspecified: Secondary | ICD-10-CM

## 2022-06-05 DIAGNOSIS — S060X0A Concussion without loss of consciousness, initial encounter: Secondary | ICD-10-CM

## 2022-06-05 DIAGNOSIS — I609 Nontraumatic subarachnoid hemorrhage, unspecified: Secondary | ICD-10-CM

## 2022-06-05 MED ORDER — MELOXICAM 15 MG PO TABS: 15.0000 mg | ORAL_TABLET | Freq: Every day | ORAL | 0 refills | Status: DC

## 2022-06-05 NOTE — Progress Notes (Signed)
Penny Acosta D.[place] Phone: [phone]  Assessment and Plan:     1. Concussion without loss of consciousness, initial encounter 2. Closed fracture of left side of occipital bone, unspecified occipital fracture type, initial encounter ([place]) 3. Subarachnoid hemorrhage ([facility]) -Acute, uncertain prognosis, initial sports medicine visit - Concussion diagnosed based off of HPI, physical exam, symptom severity score, special testing, ER note review - Concussion is complicated based on small subarachnoid hemorrhage and left occiput fracture seen on CT scans from date of injury at ER on 05/30/2022.  Reassuring that there is no increasing subarachnoid hemorrhage between separate CT head scans on that day and reassuring the patient has not had red flag symptoms since that time to indicate further bleeding - Recommend no work at this time for 1 week or until reevaluated.  Note provided - Start meloxicam 15 mg daily x2 weeks.  If still having pain after 2 weeks, complete 3rd-week of meloxicam. May use remaining meloxicam as needed once daily for pain control.  Do not to use additional NSAIDs while taking meloxicam.  May use Tylenol [phone] mg 2 to 3 times a day for breakthrough pain.  As it has been 6 days since fall without progression of symptoms, I believe it is safe to start NSAID therapy at this time - Patient is not on blood thinners or anticoagulants which was misstated in ER note - Start HEP for back and neck - Do not recommend driving at this time  4. Acute bilateral low back pain, unspecified whether sciatica present -Acute, uncomplicated, initial visit - X-rays obtained in clinic due to trauma and continued pain.  My rotation: No acute fracture dislocation or vertebral collapse.  Grade 1 anterior listhesis L4 and L5. - Start meloxicam 15 mg daily x2 weeks.  If still having pain after 2 weeks, complete 3rd-week of meloxicam. May  use remaining meloxicam as needed once daily for pain control.  Do not to use additional NSAIDs while taking meloxicam.  May use Tylenol [phone] mg 2 to 3 times a day for breakthrough pain. - Start HEP for back and neck - DG Lumbar Spine 2-3 Views; Future - DG Pelvis 1-2 Views; Future  Other orders - meloxicam (MOBIC) 15 MG tablet; Take 1 tablet (15 mg total) by mouth daily.    Date of injury was 05/30/2022. Original symptom severity scores were 22 and 110. The patient was counseled on the nature of the injury, typical course and potential options for further evaluation and treatment. Discussed the importance of compliance with recommendations. Patient stated understanding of this plan and willingness to comply.  Recommendations:  -  Complete mental and physical rest for 48 hours after concussive event - Recommend light aerobic activity while keeping symptoms less than 3/10 - Stop mental or physical activities that cause symptoms to worsen greater than 3/10, and wait 24 hours before attempting them again - Eliminate screen time as much as possible for first 48 hours after concussive event, then continue limited screen time (recommend less than 2 hours per day)   - Encouraged to RTC in 1 week for reassessment or sooner for any concerns or acute changes   Pertinent previous records reviewed include CT head 05/30/2022, repeat CT head 05/30/2022, CT C-spine 05/30/2022, ER note 05/30/2022   Time of visit 51 minutes, which included chart review, physical exam, treatment plan, symptom severity score, VOMS, and tandem gait testing being performed, interpreted, and discussed with patient  at today's visit.   Subjective:   I, Penny Acosta, am serving as a Education administrator for Doctor Penny Acosta  Chief Complaint: concussion symptoms   HPI:   06/05/22 Patient is a 51 year old female complaining of concussion symptoms. Patient states  head injury that occurred this morning.  She states that she will  frequently get lightheaded from time to time particularly in the morning if she stands up too quickly.  States that she typically takes her time getting out of bed but this morning was in a hurry got up felt lightheaded fell and hit the ground.  Husband was in the room was awoken by her falling and hitting the ground.  She hit the back of her head is complaining of some neck discomfort.  No other pain.  Reportedly was bit confused after the injury.  She was able to ambulate to the car and into the ER.  No reported seizure-like activity no numbness or tingling.  Does have mild headache.  No blurry vision   Concussion HPI:  - Injury date: 05/30/2022   - Mechanism of injury: fall   - LOC: yes   - Initial evaluation: ED  - Previous head injuries/concussions: no   - Previous imaging: yes  for headaches     - Social history: work    Hospitalization for head injury? No Diagnosed/treated for headache disorder or migraines? No Diagnosed with learning disability [name]? No Diagnosed with ADD/ADHD? No Diagnose with Depression, anxiety, or other Psychiatric Disorder? Yes    Current medications:  Current Outpatient Medications  Medication Sig Dispense Refill   cyclobenzaprine (FLEXERIL) 10 MG tablet Take 10 mg by mouth 3 (three) times daily as needed for muscle spasms.     cyclobenzaprine (FLEXERIL) 5 MG tablet Take 1 tablet (5 mg total) by mouth 3 (three) times daily as needed for muscle spasms. 12 tablet 0   ibuprofen (ADVIL) 200 MG tablet Take 400 mg by mouth every 6 (six) hours as needed.     meloxicam (MOBIC) 15 MG tablet Take 1 tablet (15 mg total) by mouth daily. 30 tablet 0   ondansetron (ZOFRAN-ODT) 4 MG disintegrating tablet Take 1 tablet (4 mg total) by mouth every 8 (eight) hours as needed for nausea or vomiting. 8 tablet 0   oxyCODONE-acetaminophen (PERCOCET) 5-325 MG tablet Take 1 tablet by mouth every 4 (four) hours as needed for severe pain. 14 tablet 0   tamoxifen (NOLVADEX) 20 MG  tablet TAKE 1 TABLET BY MOUTH EVERY DAY 90 tablet 2   traZODone (DESYREL) 50 MG tablet TAKE 1 TABLET BY MOUTH NIGHTLY AS NEEDED FOR SLEEP     escitalopram (LEXAPRO) 5 MG tablet Take by mouth. (Patient not taking: Reported on 12/30/2021)     Semaglutide,0.25 or 0.'5MG'$ /DOS, 2 MG/1.5ML SOPN Inject into the skin.     No current facility-administered medications for this visit.      Objective:     Vitals:   06/05/22 0756  BP: 118/76  Pulse: 83  SpO2: 100%  Weight: 153 lb (69.4 kg)  Height: '5\' 2"'$  (1.575 m)      Body mass index is 27.98 kg/m.    Physical Exam:     General: Well-appearing, cooperative, sitting comfortably in no acute distress.  Psychiatric: Mood and affect are appropriate.     Today's Symptom Severity Score:  Scores: 0-6  Headache:6 "Pressure in head":6  Neck Pain:6 Nausea or vomiting:6 Dizziness:6 Blurred vision:4 Balance problems:5 Sensitivity to light:3 Sensitivity to noise:5 Feeling slowed  down:6 Feeling like "in a fog":6 "Don't feel right":6 Difficulty concentrating:6 Difficulty remembering:6  Fatigue or low energy:6 Confusion:5  Drowsiness:5  More emotional:3 Irritability:3 Sadness:3  Nervous or Anxious:5 Trouble falling asleep:2  Total number of symptoms: 22/22  Symptom Severity index: 110/132  Worse with physical activity? Yes  Worse with mental activity? Yes Percent improved since injury: 20%    Full pain-free cervical PROM: yes but with tightness at end range of motion   Tandem gait: - Forward, eyes open: 3 errors - Backward, eyes open: 4 errors - Forward, eyes closed: Not performed due to instability - Backward, eyes closed: Not performed due to instability  VOMS:   - Baseline symptoms: Mild headache and nausea - Horizontal Vestibular-Ocular Reflex: Dizzy 2/10  - Vertical Vestibular-Ocular Reflex: Dizzy 1/10  - Smooth pursuits: Difficulty keeping up - Horizontal Saccades:  0/10  - Vertical Saccades: 0/10  - Visual Motion  Sensitivity Test: Dizzy 3/10  - Convergence: 4, 4 cm (<5 cm normal)     Electronically signed by:  Penny Acosta D.[name] 9:40 AM 06/05/22

## 2022-06-05 NOTE — Patient Instructions (Addendum)
Good to see you Xrays on the way out  Recommendations:  -           Complete mental and physical rest for 48 hours after concussive event -           Recommend light aerobic activity while keeping symptoms less than 3/10 -           Stop mental or physical activities that cause symptoms to worsen greater than 3/10, and wait 24 hours before attempting them again -           Eliminate screen time as much as possible for first 48 hours after concussive event, then continue limited screen time (recommend less than 2 hours per day) - Start meloxicam 15 mg daily x2 weeks.  If still having pain after 2 weeks, complete 3rd-week of meloxicam. May use remaining meloxicam as needed once daily for pain control.  Do not to use additional NSAIDs while taking meloxicam.  May use Tylenol [phone] mg 2 to 3 times a day for breakthrough pain. Work note provided Neck low back HEP  1 week follow up

## 2022-06-11 NOTE — Progress Notes (Unsigned)
Penny Acosta D.[place] Phone: [phone]  Assessment and Plan:     There are no diagnoses linked to this encounter.  ***    Date of injury was 05/30/2022. Symptom severity scores of *** and *** today. Original symptom severity scores were 22 and 110. The patient was counseled on the nature of the injury, typical course and potential options for further evaluation and treatment. Discussed the importance of compliance with recommendations. Patient stated understanding of this plan and willingness to comply.  Recommendations:  -  Complete mental and physical rest for 48 hours after concussive event - Recommend light aerobic activity while keeping symptoms less than 3/10 - Stop mental or physical activities that cause symptoms to worsen greater than 3/10, and wait 24 hours before attempting them again - Eliminate screen time as much as possible for first 48 hours after concussive event, then continue limited screen time (recommend less than 2 hours per day)   - Encouraged to RTC in *** for reassessment or sooner for any concerns or acute changes   Pertinent previous records reviewed include ***   Time of visit *** minutes, which included chart review, physical exam, treatment plan, symptom severity score, VOMS, and tandem gait testing being performed, interpreted, and discussed with patient at today's visit.   Subjective:   I, Penny Acosta, am serving as a Education administrator for Doctor Glennon Mac   Chief Complaint: concussion symptoms    HPI:    06/05/22 Patient is a 51 year old female complaining of concussion symptoms. Patient states  head injury that occurred this morning.  She states that she will frequently get lightheaded from time to time particularly in the morning if she stands up too quickly.  States that she typically takes her time getting out of bed but this morning was in a hurry got up felt lightheaded fell  and hit the ground.  Husband was in the room was awoken by her falling and hitting the ground.  She hit the back of her head is complaining of some neck discomfort.  No other pain.  Reportedly was bit confused after the injury.  She was able to ambulate to the car and into the ER.  No reported seizure-like activity no numbness or tingling.  Does have mild headache.  No blurry vision  06/12/2022 Patient states    Concussion HPI:  - Injury date: 05/30/2022   - Mechanism of injury: fall   - LOC: yes   - Initial evaluation: ED  - Previous head injuries/concussions: no   - Previous imaging: yes  for headaches     - Social history: work    Hospitalization for head injury? No Diagnosed/treated for headache disorder or migraines? No Diagnosed with learning disability Penny Acosta? No Diagnosed with ADD/ADHD? No Diagnose with Depression, anxiety, or other Psychiatric Disorder? Yes    Current medications:  Current Outpatient Medications  Medication Sig Dispense Refill   cyclobenzaprine (FLEXERIL) 10 MG tablet Take 10 mg by mouth 3 (three) times daily as needed for muscle spasms.     cyclobenzaprine (FLEXERIL) 5 MG tablet Take 1 tablet (5 mg total) by mouth 3 (three) times daily as needed for muscle spasms. 12 tablet 0   escitalopram (LEXAPRO) 5 MG tablet Take by mouth. (Patient not taking: Reported on 12/30/2021)     ibuprofen (ADVIL) 200 MG tablet Take 400 mg by mouth every 6 (six) hours as needed.     meloxicam (  MOBIC) 15 MG tablet Take 1 tablet (15 mg total) by mouth daily. 30 tablet 0   ondansetron (ZOFRAN-ODT) 4 MG disintegrating tablet Take 1 tablet (4 mg total) by mouth every 8 (eight) hours as needed for nausea or vomiting. 8 tablet 0   oxyCODONE-acetaminophen (PERCOCET) 5-325 MG tablet Take 1 tablet by mouth every 4 (four) hours as needed for severe pain. 14 tablet 0   Semaglutide,0.25 or 0.'5MG'$ /DOS, 2 MG/1.5ML SOPN Inject into the skin.     tamoxifen (NOLVADEX) 20 MG tablet TAKE 1 TABLET BY  MOUTH EVERY DAY 90 tablet 2   traZODone (DESYREL) 50 MG tablet TAKE 1 TABLET BY MOUTH NIGHTLY AS NEEDED FOR SLEEP     No current facility-administered medications for this visit.      Objective:     There were no vitals filed for this visit.    There is no height or weight on file to calculate BMI.    Physical Exam:     General: Well-appearing, cooperative, sitting comfortably in no acute distress.  Psychiatric: Mood and affect are appropriate.     Today's Symptom Severity Score:  Scores: 0-6  Headache:*** "Pressure in head":***  Neck Pain:*** Nausea or vomiting:*** Dizziness:*** Blurred vision:*** Balance problems:*** Sensitivity to light:*** Sensitivity to noise:*** Feeling slowed down:*** Feeling like "in a fog":*** "Don't feel right":*** Difficulty concentrating:*** Difficulty remembering:***  Fatigue or low energy:*** Confusion:***  Drowsiness:***  More emotional:*** Irritability:*** Sadness:***  Nervous or Anxious:*** Trouble falling asleep:***  Total number of symptoms: ***/22  Symptom Severity index: ***/132  Worse with physical activity? No*** Worse with mental activity? No*** Percent improved since injury: ***%    Full pain-free cervical PROM: yes***    Tandem gait: - Forward, eyes open: *** errors - Backward, eyes open: *** errors - Forward, eyes closed: *** errors - Backward, eyes closed: *** errors  VOMS:   - Baseline symptoms: *** - Horizontal Vestibular-Ocular Reflex: ***/10  - Vertical Vestibular-Ocular Reflex: ***/10  - Smooth pursuits: ***/10  - Horizontal Saccades:  ***/10  - Vertical Saccades: ***/10  - Visual Motion Sensitivity Test:  ***/10  - Convergence: ***cm (<5 cm normal)     Electronically signed by:  Penny Acosta D.[name] 8:04 AM 06/11/22

## 2022-06-12 ENCOUNTER — Ambulatory Visit: Payer: BC Managed Care – PPO | Admitting: Sports Medicine

## 2022-06-12 VITALS — BP 110/68 | HR 89 | Ht 62.0 in | Wt 156.0 lb

## 2022-06-12 DIAGNOSIS — S02119A Unspecified fracture of occiput, initial encounter for closed fracture: Secondary | ICD-10-CM

## 2022-06-12 DIAGNOSIS — G4700 Insomnia, unspecified: Secondary | ICD-10-CM

## 2022-06-12 DIAGNOSIS — S060X0A Concussion without loss of consciousness, initial encounter: Secondary | ICD-10-CM | POA: Diagnosis not present

## 2022-06-12 DIAGNOSIS — I609 Nontraumatic subarachnoid hemorrhage, unspecified: Secondary | ICD-10-CM

## 2022-06-12 DIAGNOSIS — M545 Low back pain, unspecified: Secondary | ICD-10-CM | POA: Diagnosis not present

## 2022-06-12 NOTE — Patient Instructions (Addendum)
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

## 2022-06-16 ENCOUNTER — Ambulatory Visit
Admission: RE | Admit: 2022-06-16 | Discharge: 2022-06-16 | Disposition: A | Discharge status: Home / Self Care | Payer: BC Managed Care – PPO | Source: Ambulatory Visit | Attending: Oncology | Admitting: Oncology

## 2022-06-16 DIAGNOSIS — D0502 Lobular carcinoma in situ of left breast: Secondary | ICD-10-CM | POA: Insufficient documentation

## 2022-06-18 NOTE — Progress Notes (Unsigned)
Penny Acosta D.[facility] [place] Phone: [phone]  Assessment and Plan:     There are no diagnoses linked to this encounter.  ***    Date of injury was 05/30/2022. Symptom severity scores of *** and *** today. Original symptom severity scores were 22 and 110. The patient was counseled on the nature of the injury, typical course and potential options for further evaluation and treatment. Discussed the importance of compliance with recommendations. Patient stated understanding of this plan and willingness to comply.  Recommendations:  -  Complete mental and physical rest for 48 hours after concussive event - Recommend light aerobic activity while keeping symptoms less than 3/10 - Stop mental or physical activities that cause symptoms to worsen greater than 3/10, and wait 24 hours before attempting them again - Eliminate screen time as much as possible for first 48 hours after concussive event, then continue limited screen time (recommend less than 2 hours per day)   - Encouraged to RTC in *** for reassessment or sooner for any concerns or acute changes   Pertinent previous records reviewed include ***   Time of visit *** minutes, which included chart review, physical exam, treatment plan, symptom severity score, VOMS, and tandem gait testing being performed, interpreted, and discussed with patient at today's visit.   Subjective:   I, [name], am serving as a Education administrator for Doctor Glennon Mac   Chief Complaint: concussion symptoms    HPI:    06/05/22 Patient is a 51 year old female complaining of concussion symptoms. Patient states  head injury that occurred this morning.  She states that she will frequently get lightheaded from time to time particularly in the morning if she stands up too quickly.  States that she typically takes her time getting out of bed but this morning was in a hurry got up felt lightheaded fell  and hit the ground.  Husband was in the room was awoken by her falling and hitting the ground.  She hit the back of her head is complaining of some neck discomfort.  No other pain.  Reportedly was bit confused after the injury.  She was able to ambulate to the car and into the ER.  No reported seizure-like activity no numbness or tingling.  Does have mild headache.  No blurry vision   06/12/2022 Patient states that she does feel better  wants to know if she can take trazodone cause her insomnia is cranked up , can fall a sleep its the staying a sleep , states that melatonin does not work for her , feels her symptoms the most in the morning    06/19/2022 Patient states   Concussion HPI:  - Injury date: 05/30/2022   - Mechanism of injury: fall   - LOC: yes   - Initial evaluation: ED  - Previous head injuries/concussions: no   - Previous imaging: yes  for headaches     - Social history: work    Hospitalization for head injury? No Diagnosed/treated for headache disorder or migraines? No Diagnosed with learning disability Penny Acosta? No Diagnosed with ADD/ADHD? No Diagnose with Depression, anxiety, or other Psychiatric Disorder? Yes    Current medications:  Current Outpatient Medications  Medication Sig Dispense Refill   cyclobenzaprine (FLEXERIL) 10 MG tablet Take 10 mg by mouth 3 (three) times daily as needed for muscle spasms.     cyclobenzaprine (FLEXERIL) 5 MG tablet Take 1 tablet (5 mg total) by  mouth 3 (three) times daily as needed for muscle spasms. 12 tablet 0   escitalopram (LEXAPRO) 5 MG tablet Take by mouth. (Patient not taking: Reported on 12/30/2021)     ibuprofen (ADVIL) 200 MG tablet Take 400 mg by mouth every 6 (six) hours as needed.     meloxicam (MOBIC) 15 MG tablet Take 1 tablet (15 mg total) by mouth daily. 30 tablet 0   ondansetron (ZOFRAN-ODT) 4 MG disintegrating tablet Take 1 tablet (4 mg total) by mouth every 8 (eight) hours as needed for nausea or vomiting. 8 tablet 0    oxyCODONE-acetaminophen (PERCOCET) 5-325 MG tablet Take 1 tablet by mouth every 4 (four) hours as needed for severe pain. 14 tablet 0   Semaglutide,0.25 or 0.'5MG'$ /DOS, 2 MG/1.5ML SOPN Inject into the skin.     tamoxifen (NOLVADEX) 20 MG tablet TAKE 1 TABLET BY MOUTH EVERY DAY 90 tablet 2   traZODone (DESYREL) 50 MG tablet TAKE 1 TABLET BY MOUTH NIGHTLY AS NEEDED FOR SLEEP     No current facility-administered medications for this visit.      Objective:     There were no vitals filed for this visit.    There is no height or weight on file to calculate BMI.    Physical Exam:     General: Well-appearing, cooperative, sitting comfortably in no acute distress.  Psychiatric: Mood and affect are appropriate.     Today's Symptom Severity Score:  Scores: 0-6  Headache:*** "Pressure in head":***  Neck Pain:*** Nausea or vomiting:*** Dizziness:*** Blurred vision:*** Balance problems:*** Sensitivity to light:*** Sensitivity to noise:*** Feeling slowed down:*** Feeling like "in a fog":*** "Don't feel right":*** Difficulty concentrating:*** Difficulty remembering:***  Fatigue or low energy:*** Confusion:***  Drowsiness:***  More emotional:*** Irritability:*** Sadness:***  Nervous or Anxious:*** Trouble falling asleep:***  Total number of symptoms: ***/22  Symptom Severity index: ***/132  Worse with physical activity? No*** Worse with mental activity? No*** Percent improved since injury: ***%    Full pain-free cervical PROM: yes***    Tandem gait: - Forward, eyes open: *** errors - Backward, eyes open: *** errors - Forward, eyes closed: *** errors - Backward, eyes closed: *** errors  VOMS:   - Baseline symptoms: *** - Horizontal Vestibular-Ocular Reflex: ***/10  - Vertical Vestibular-Ocular Reflex: ***/10  - Smooth pursuits: ***/10  - Horizontal Saccades:  ***/10  - Vertical Saccades: ***/10  - Visual Motion Sensitivity Test:  ***/10  - Convergence: ***cm (<5 cm  normal)     Electronically signed by:  Penny Acosta D.[name] 7:39 AM 06/18/22

## 2022-06-19 ENCOUNTER — Ambulatory Visit: Payer: BC Managed Care – PPO | Admitting: Sports Medicine

## 2022-06-19 VITALS — BP 106/70 | HR 78 | Ht 62.0 in | Wt 156.0 lb

## 2022-06-19 DIAGNOSIS — I609 Nontraumatic subarachnoid hemorrhage, unspecified: Secondary | ICD-10-CM

## 2022-06-19 DIAGNOSIS — M545 Low back pain, unspecified: Secondary | ICD-10-CM | POA: Diagnosis not present

## 2022-06-19 DIAGNOSIS — G4700 Insomnia, unspecified: Secondary | ICD-10-CM

## 2022-06-19 DIAGNOSIS — S02119A Unspecified fracture of occiput, initial encounter for closed fracture: Secondary | ICD-10-CM

## 2022-06-19 DIAGNOSIS — S060X0A Concussion without loss of consciousness, initial encounter: Secondary | ICD-10-CM

## 2022-06-19 NOTE — Patient Instructions (Signed)
Good to see you Continue trazodone 10 mg nightly  Return to work 4 hours maximum for 1 week then can increase to 6 hours maximum for the following week  2 week follow up

## 2022-06-24 ENCOUNTER — Encounter: Payer: Self-pay | Admitting: Sports Medicine

## 2022-07-01 ENCOUNTER — Inpatient Hospital Stay: Payer: BC Managed Care – PPO | Attending: Oncology | Admitting: Oncology

## 2022-07-01 ENCOUNTER — Other Ambulatory Visit: Payer: Self-pay

## 2022-07-01 ENCOUNTER — Ambulatory Visit: Payer: BC Managed Care – PPO | Admitting: Oncology

## 2022-07-01 DIAGNOSIS — Z5181 Encounter for therapeutic drug level monitoring: Secondary | ICD-10-CM

## 2022-07-01 DIAGNOSIS — Z79810 Long term (current) use of selective estrogen receptor modulators (SERMs): Secondary | ICD-10-CM

## 2022-07-01 DIAGNOSIS — D0502 Lobular carcinoma in situ of left breast: Secondary | ICD-10-CM | POA: Diagnosis not present

## 2022-07-01 DIAGNOSIS — Z08 Encounter for follow-up examination after completed treatment for malignant neoplasm: Secondary | ICD-10-CM

## 2022-07-02 ENCOUNTER — Other Ambulatory Visit: Payer: Self-pay | Admitting: Sports Medicine

## 2022-07-02 NOTE — Progress Notes (Signed)
I connected with Penny Acosta on 07/02/22 at  2:15 PM EDT by video enabled telemedicine visit and verified that I am speaking with the correct person using two identifiers.   I discussed the limitations, risks, security and privacy concerns of performing an evaluation and management service by telemedicine and the availability of in-person appointments. I also discussed with the patient that there may be a patient responsible charge related to this service. The patient expressed understanding and agreed to proceed.  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  biopsy specimen showed mixed features of dcis and lcis.  She had b/l breast MRI which showed lobular mass in the right breast and possible complex cyst in the left breast. Left breast cyst was a benign sebaceous cyst. Right breast biopsy showed  FIBROCYSTIC CHANGE WITH APOCRINE METAPLASIA, PSEUDOANGIOMATOUS STROMAL HYPERPLASIA.  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  ER greater than 90% positive   Patient is premenopausal and was started on tamoxifen for chemoprevention in May 2021  Interval history patient reports no significant side effects fromTamoxifen.  She did recently have an episode of concussion resulting in fracture of the occipital lobe and subarachnoid hemorrhage.  Symptoms are gradually improving   Review of Systems  Constitutional:  Negative for chills,  fever, malaise/fatigue and weight loss.  HENT:  Negative for congestion, ear discharge and nosebleeds.   Eyes:  Negative for blurred vision.  Respiratory:  Negative for cough, hemoptysis, sputum production, shortness of breath and wheezing.   Cardiovascular:  Negative for chest pain, palpitations, orthopnea and claudication.  Gastrointestinal:  Negative for abdominal pain, blood in stool, constipation, diarrhea, heartburn, melena, nausea and vomiting.  Genitourinary:  Negative for dysuria, flank pain, frequency, hematuria and urgency.  Musculoskeletal:  Negative for back pain, joint pain and myalgias.  Skin:  Negative for rash.  Neurological:  Negative for dizziness, tingling, focal weakness, seizures, weakness and headaches.  Endo/Heme/Allergies:  Does not bruise/bleed easily.  Psychiatric/Behavioral:  Negative for depression and suicidal ideas. The patient does not have insomnia.     No Known Allergies  Past Medical History:  Diagnosis Date   Anxiety    Breast cancer ([place])    Cancer ([place])    Complication of anesthesia    GERD (gastroesophageal reflux disease)    occ   IUD 02/2016   MIRENA INSERTED 11/2005, 10/2010. 02/2016   PONV (postoperative nausea and vomiting)     Past Surgical History:  Procedure Laterality Date   BREAST BIOPSY Left 01/03/2020   [facility] UOQ, ribbon marker, path pending   BREAST LUMPECTOMY     CESAREAN SECTION  2007   TWINS   INTRAUTERINE DEVICE INSERTION  02/2016   mirena   PART MASTECTOMY,RADIO FREQUENCY LOCALIZER,AXILLARY SENTINEL NODE BIOPSY Left 01/20/2020   Procedure: PARTIAL MASTECTOMY WITH Radio Frequency AND AXILLARY SENTINEL LYMPH NODE BX;  Surgeon: Herbert Pun, MD;  Location: [facility] ORS;  Service: General;  Laterality: Left;   REDUCTION MAMMAPLASTY      Social History  Socioeconomic History   Marital status: Married    Spouse name: Not on file   Number of children: Not on file   Years of education: Not on file   Highest education  level: Not on file  Occupational History   Not on file  Tobacco Use   Smoking status: Never   Smokeless tobacco: Never  Vaping Use   Vaping Use: Never used  Substance and Sexual Activity   Alcohol use: Not Currently   Drug use: No   Sexual activity: Yes    Partners: Male    Birth control/protection: Condom    Comment: 1st intercourse 51 yo-More than 5 partners  Other Topics Concern   Not on file  Social History Narrative   Not on file   Social Determinants of Health   Financial Resource Strain: Not on file  Food Insecurity: Not on file  Transportation Needs: Not on file  Physical Activity: Not on file  Stress: Not on file  Social Connections: Not on file  Intimate Partner Violence: Not on file    Family History  Problem Relation Age of Onset   Diabetes Father    Parkinsonism Father    Heart failure Father    Prostate cancer Father        dx 80s   Colon polyps Father    Turner syndrome Sister    Cancer Mother 46       pancreatic capsulated, found when took gallbaldder out   Colon polyps Mother    Breast cancer Neg Hx      Current Outpatient Medications:    cyclobenzaprine (FLEXERIL) 10 MG tablet, Take 10 mg by mouth 3 (three) times daily as needed for muscle spasms., Disp: , Rfl:    cyclobenzaprine (FLEXERIL) 5 MG tablet, Take 1 tablet (5 mg total) by mouth 3 (three) times daily as needed for muscle spasms., Disp: 12 tablet, Rfl: 0   ibuprofen (ADVIL) 200 MG tablet, Take 400 mg by mouth every 6 (six) hours as needed., Disp: , Rfl:    meloxicam (MOBIC) 15 MG tablet, Take 1 tablet (15 mg total) by mouth daily., Disp: 30 tablet, Rfl: 0   tamoxifen (NOLVADEX) 20 MG tablet, TAKE 1 TABLET BY MOUTH EVERY DAY, Disp: 90 tablet, Rfl: 2   traZODone (DESYREL) 50 MG tablet, TAKE 1 TABLET BY MOUTH NIGHTLY AS NEEDED FOR SLEEP, Disp: , Rfl:   DG Bone Density  Result Date: 06/16/2022 EXAM: DUAL X-RAY ABSORPTIOMETRY (DXA) FOR BONE MINERAL DENSITY IMPRESSION: Your patient Penny Acosta completed a BMD test on 06/16/2022 using the [place] (software version: 14.10) manufactured by [employer]. The following summarizes the results of our evaluation. Technologist: [facility] PATIENT BIOGRAPHICAL: Name: Penny Acosta, Penny Acosta Patient ID: [id number] Birth Date: 1971/05/24 Height: 62.0 in. Gender: Female Exam Date: 06/16/2022 Weight: 154.1 lbs. Indications: Caucasian, High Risk Meds, History of Breast Cancer, Postmenopausal Fractures: Skull Treatments: Tamoxifen DENSITOMETRY RESULTS: Site         Region     Measured Date Measured Age WHO Classification Young Adult T-score BMD         %Change vs. Previous Significant Change (*) AP Spine L1-L4 06/16/2020 53.5 Normal 3.3 1.606 g/cm2 DualFemur Neck Right 06/16/2022 53.5 Normal 1.0 1.178 g/cm2 Left Forearm Radius 33% 06/16/2022 53.5 Normal 1.5 1.005 g/cm2 ASSESSMENT: The BMD measured at Femur Neck Right is 1.178 g/cm2 with a T-score of 1.0. This patient is considered normal according to [facility]) criteria. The scan quality  is good. World Pharmacologist [facility]) criteria for post-menopausal, Caucasian Women: Normal:                   T-score at or above -1 SD Osteopenia/low bone mass: T-score between -1 and -2.5 SD Osteoporosis:             T-score at or below -2.5 SD RECOMMENDATIONS: 1. All patients should optimize calcium and vitamin D intake. 2. Consider FDA-approved medical therapies in postmenopausal women and men aged 13 years and older, based on the following: a. A hip or vertebral(clinical or morphometric) fracture b. T-score < -2.5 at the femoral neck or spine after appropriate evaluation to exclude secondary causes c. Low bone mass (T-score between -1.0 and -2.5 at the femoral neck or spine) and a 10-year probability of a hip fracture > 3% or a 10-year probability of a major osteoporosis-related fracture > 20% based on the [place]-adapted WHO algorithm 3. Clinician judgment and/or patient preferences may  indicate treatment for people with 10-year fracture probabilities above or below these levels FOLLOW-UP: People with diagnosed cases of osteoporosis or at high risk for fracture should have regular bone mineral density tests. For patients eligible for Medicare, routine testing is allowed once every 2 years. The testing frequency can be increased to one year for patients who have rapidly progressing disease, those who are receiving or discontinuing medical therapy to restore bone mass, or have additional risk factors. I have reviewed this report, and agree with the above findings. [facility] Electronically Signed   By: Ammie Ferrier M.D.   On: 06/16/2022 10:38   DG Pelvis 1-2 Views  Result Date: 06/06/2022 CLINICAL DATA:  Low back pain. EXAM: PELVIS - 1-2 VIEW COMPARISON:  None Available. FINDINGS: There is no evidence of pelvic fracture or diastasis. No pelvic bone lesions are seen. IMPRESSION: Negative. Electronically Signed   By: Ronney Asters M.D.   On: 06/06/2022 20:56   DG Lumbar Spine 2-3 Views  Result Date: 06/06/2022 CLINICAL DATA:  Low back pain. EXAM: LUMBAR SPINE - 2-3 VIEW COMPARISON:  None Available. FINDINGS: Five lumbar type vertebra. There is no acute fracture or subluxation of the lumbar spine. There is grade 1 L4-L5 anterolisthesis. Mild multilevel degenerative changes with disc space narrowing and spurring. There is mild lower lumbar facet arthropathy. The visualized posterior elements are intact the soft tissues are unremarkable. IMPRESSION: No acute fracture or subluxation of the lumbar spine. Electronically Signed   By: Anner Crete M.D.   On: 06/06/2022 20:54    No images are attached to the encounter.      Latest Ref Rng & Units 05/30/2022    8:01 AM  CMP  Glucose 70 - 99 mg/dL 119   BUN 6 - 20 mg/dL 14   Creatinine 0.44 - 1.00 mg/dL 0.93   Sodium 135 - 145 mmol/L 140   Potassium 3.5 - 5.1 mmol/L 4.0   Chloride 98 - 111 mmol/L 113   CO2 22 - 32  mmol/L 20   Calcium 8.9 - 10.3 mg/dL 8.6       Latest Ref Rng & Units 05/30/2022    8:01 AM  CBC  WBC 4.0 - 10.5 K/uL 5.6   Hemoglobin 12.0 - 15.0 g/dL 11.9   Hematocrit 36.0 - 46.0 % 36.8   Platelets 150 - 400 K/uL 202      Observation/objective: Appears in no acute distress over video visit today.  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  She will continue taking that for a total 5 years ending in May 2026.  Her recent bone density scan was normal. As per Tyrer-Cuzick model her lifetime risk of developing breast cancer was >20%.  It would therefore be reasonable to offer her mammograms alternating with MRIs if her insurance approves.  We will try to get her MRI scheduled this month or next month.  I will see her back in 6 months no labs  Follow-up instructions: As above  I discussed the assessment and treatment plan with the patient. The patient was provided an opportunity to ask questions and all were answered. The patient agreed with the plan and demonstrated an understanding of the instructions.   The patient was advised to call back or seek an in-person evaluation if the symptoms worsen or if the condition fails to improve as anticipated.  I provided 12 minutes of face-to-face video visit time during this encounter, and > 50% was spent counseling as documented under my assessment & plan.  Visit Diagnosis: 1. Breast neoplasm, Tis (LCIS), left   2. Encounter for monitoring tamoxifen therapy     Dr. Randa Evens, MD, MPH [facility] Tel- [phone] 07/02/2022 1:00 PM

## 2022-07-03 ENCOUNTER — Ambulatory Visit: Payer: BC Managed Care – PPO | Admitting: Sports Medicine

## 2022-07-03 VITALS — BP 100/80 | HR 79 | Ht 62.0 in | Wt 153.0 lb

## 2022-07-03 DIAGNOSIS — S02119A Unspecified fracture of occiput, initial encounter for closed fracture: Secondary | ICD-10-CM | POA: Diagnosis not present

## 2022-07-03 DIAGNOSIS — R430 Anosmia: Secondary | ICD-10-CM

## 2022-07-03 DIAGNOSIS — R270 Ataxia, unspecified: Secondary | ICD-10-CM | POA: Diagnosis not present

## 2022-07-03 DIAGNOSIS — S060X0A Concussion without loss of consciousness, initial encounter: Secondary | ICD-10-CM | POA: Diagnosis not present

## 2022-07-03 DIAGNOSIS — R432 Parageusia: Secondary | ICD-10-CM

## 2022-07-03 DIAGNOSIS — I609 Nontraumatic subarachnoid hemorrhage, unspecified: Secondary | ICD-10-CM

## 2022-07-03 DIAGNOSIS — R55 Syncope and collapse: Secondary | ICD-10-CM

## 2022-07-03 DIAGNOSIS — G4700 Insomnia, unspecified: Secondary | ICD-10-CM | POA: Diagnosis not present

## 2022-07-03 NOTE — Progress Notes (Signed)
Benito Mccreedy D.[place] Phone: [phone]  Assessment and Plan:    1. Concussion without loss of consciousness, initial encounter 2. Closed fracture of left side of occipital bone, unspecified occipital fracture type, initial encounter ([place]) 3. Subarachnoid hemorrhage (HCC) -Acute, improving, subsequent visit - Overall continued mild improvement with slight flare in symptoms since increasing to 6-hour workdays this past week - Concussion is complicated based on small subarachnoid hemorrhage and left occiput fracture seen on CT scans from date of injury at ER on 05/30/2022.  Reassuring that there was no increasing subarachnoid hemorrhage between separate CT scans on same day - Patient continuing to experience loss of smell and taste since accident - Recommend decreasing to 4-hour workdays maximum for the remainder of this week and through next week.  May increase to 6-hour workdays maximum starting 07/14/2022 until reevaluated.  Work note provided. - Ambulatory referral to Physical Therapy  4. Insomnia, unspecified type -Chronic, improving, subsequent visit - Continuing to experience better sleep, generally 7 to 8 hours continuously since restarting trazodone 100 mg nightly - Continue trazodone 100 mg nightly  5. Ataxia -Acute - Continued symptoms of ataxia consistent with concussion - Referral to vestibular therapy  6. Loss of smell 7. Loss of taste -Acute, unchanged - Unchanged loss of smell and taste since patient's accident - I am unsure of the cause of the symptoms at this time.  These are not common concussion symptoms.  Patient may have contracted COVID around this timeframe including going in and out of the ER and these could be related symptoms. - Patient has appointment with ENT  8. Syncope, unspecified syncope type  -Acute - Recommend work-up with primary care physician.  Could discuss EKG versus  ambulatory heart rate monitor versus orthostatic vitals versus other syncope work-up  Date of injury was 05/30/2022. Symptom severity scores of 19 and 53 today. Original symptom severity scores were 22 and 110. The patient was counseled on the nature of the injury, typical course and potential options for further evaluation and treatment. Discussed the importance of compliance with recommendations. Patient stated understanding of this plan and willingness to comply.  Recommendations:  -  Complete mental and physical rest for 48 hours after concussive event - Recommend light aerobic activity while keeping symptoms less than 3/10 - Stop mental or physical activities that cause symptoms to worsen greater than 3/10, and wait 24 hours before attempting them again - Eliminate screen time as much as possible for first 48 hours after concussive event, then continue limited screen time (recommend less than 2 hours per day)   - Encouraged to RTC in 2 weeks for reassessment or sooner for any concerns or acute changes   Pertinent previous records reviewed include none   Time of visit 38 minutes, which included chart review, physical exam, treatment plan, symptom severity score, VOMS, and tandem gait testing being performed, interpreted, and discussed with patient at today's visit.   Subjective:   I, [name], am serving as a Education administrator for Doctor Glennon Mac   Chief Complaint: concussion symptoms    HPI:    06/05/22 Patient is a 51 year old female complaining of concussion symptoms. Patient states  head injury that occurred this morning.  She states that she will frequently get lightheaded from time to time particularly in the morning if she stands up too quickly.  States that she typically takes her time getting out of bed but this  morning was in a hurry got up felt lightheaded fell and hit the ground.  Husband was in the room was awoken by her falling and hitting the ground.  She hit the back  of her head is complaining of some neck discomfort.  No other pain.  Reportedly was bit confused after the injury.  She was able to ambulate to the car and into the ER.  No reported seizure-like activity no numbness or tingling.  Does have mild headache.  No blurry vision   06/12/2022 Patient states that she does feel better  wants to know if she can take trazodone cause her insomnia is cranked up , can fall a sleep its the staying a sleep , states that melatonin does not work for her , feels her symptoms the most in the morning    07/03/2022 Patient states still cant taste or smell , feels pressurish in her sinuses     Concussion HPI:  - Injury date: 05/30/2022   - Mechanism of injury: fall   - LOC: yes   - Initial evaluation: ED  - Previous head injuries/concussions: no   - Previous imaging: yes  for headaches     - Social history: work    Hospitalization for head injury? No Diagnosed/treated for headache disorder or migraines? No Diagnosed with learning disability [name]? No Diagnosed with ADD/ADHD? No Diagnose with Depression, anxiety, or other Psychiatric Disorder? Yes    Current medications:  Current Outpatient Medications  Medication Sig Dispense Refill   cyclobenzaprine (FLEXERIL) 10 MG tablet Take 10 mg by mouth 3 (three) times daily as needed for muscle spasms.     cyclobenzaprine (FLEXERIL) 5 MG tablet Take 1 tablet (5 mg total) by mouth 3 (three) times daily as needed for muscle spasms. 12 tablet 0   ibuprofen (ADVIL) 200 MG tablet Take 400 mg by mouth every 6 (six) hours as needed.     meloxicam (MOBIC) 15 MG tablet Take 1 tablet (15 mg total) by mouth daily. 30 tablet 0   tamoxifen (NOLVADEX) 20 MG tablet TAKE 1 TABLET BY MOUTH EVERY DAY 90 tablet 2   traZODone (DESYREL) 50 MG tablet TAKE 1 TABLET BY MOUTH NIGHTLY AS NEEDED FOR SLEEP     No current facility-administered medications for this visit.      Objective:     Vitals:   07/03/22 0835  BP: 100/80   Pulse: 79  SpO2: 100%  Weight: 153 lb (69.4 kg)  Height: '5\' 2"'$  (1.575 m)      Body mass index is 27.98 kg/m.    Physical Exam:     General: Well-appearing, cooperative, sitting comfortably in no acute distress.  Psychiatric: Mood and affect are appropriate.     Today's Symptom Severity Score:  Scores: 0-6  Headache:4 "Pressure in head":4  Neck Pain:3 Nausea or vomiting:2 Dizziness:4 Blurred vision:3 Balance problems:3 Sensitivity to light:2 Sensitivity to noise:0 Feeling slowed down:3 Feeling like "in a fog":3 "Don't feel right":3 Difficulty concentrating:4 Difficulty remembering:4  Fatigue or low energy:4 Confusion:1  Drowsiness:1  More emotional:1 Irritability:1 Sadness:0  Nervous or Anxious:3 Trouble falling asleep:0  Total number of symptoms: 19/22  Symptom Severity index: 53/132  Worse with physical activity? Yes  Worse with mental activity? Yes  Percent improved since injury: 65-70%    Full pain-free cervical PROM: yes     Tandem gait: - Forward, eyes open: 1 errors - Backward, eyes open: 1 errors - Forward, eyes closed: 6 errors - Backward, eyes closed: Stopped due to  instability  VOMS:   - Baseline symptoms: 0 - Horizontal Vestibular-Ocular Reflex: 0/10  - Vertical Vestibular-Ocular Reflex: 0/10  - Smooth pursuits: Difficulty keeping up - Horizontal Saccades:  0/10  - Vertical Saccades: Abnormal eye movement with eyes moving circumferentially from top to bottom rather than vertically.  Continues to be less prominent with each visit - Visual Motion Sensitivity Test:  0/10       Electronically signed by:  Benito Mccreedy D.[name] 9:40 AM 07/03/22

## 2022-07-03 NOTE — Patient Instructions (Addendum)
Good to see you Vestibular therapy  Work note provided See pcp for syncope, you could discuss EKG , heart monitor, and orthostatic vitals  2 week follow up

## 2022-07-05 ENCOUNTER — Encounter: Payer: Self-pay | Admitting: Oncology

## 2022-07-16 NOTE — Progress Notes (Unsigned)
Penny Acosta D.[place] Phone: [phone]  Assessment and Plan:     There are no diagnoses linked to this encounter.  ***    Date of injury was 05/30/2022. Symptom severity scores of *** and *** today. Original symptom severity scores were 22 and 110. The patient was counseled on the nature of the injury, typical course and potential options for further evaluation and treatment. Discussed the importance of compliance with recommendations. Patient stated understanding of this plan and willingness to comply.  Recommendations:  -  Relative mental and physical rest for 48 hours after concussive event - Recommend light aerobic activity while keeping symptoms less than 3/10 - Stop mental or physical activities that cause symptoms to worsen greater than 3/10, and wait 24 hours before attempting them again - Eliminate screen time as much as possible for first 48 hours after concussive event, then continue limited screen time (recommend less than 2 hours per day)   - Encouraged to RTC in *** for reassessment or sooner for any concerns or acute changes   Pertinent previous records reviewed include ***   Time of visit *** minutes, which included chart review, physical exam, treatment plan, symptom severity score, VOMS, and tandem gait testing being performed, interpreted, and discussed with patient at today's visit.   Subjective:   I, Penny Acosta, am serving as a Education administrator for Doctor Glennon Mac   Chief Complaint: concussion symptoms    HPI:    06/05/22 Patient is a 51 year old female complaining of concussion symptoms. Patient states  head injury that occurred this morning.  She states that she will frequently get lightheaded from time to time particularly in the morning if she stands up too quickly.  States that she typically takes her time getting out of bed but this morning was in a hurry got up felt lightheaded fell  and hit the ground.  Husband was in the room was awoken by her falling and hitting the ground.  She hit the back of her head is complaining of some neck discomfort.  No other pain.  Reportedly was bit confused after the injury.  She was able to ambulate to the car and into the ER.  No reported seizure-like activity no numbness or tingling.  Does have mild headache.  No blurry vision   06/12/2022 Patient states that she does feel better  wants to know if she can take trazodone cause her insomnia is cranked up , can fall a sleep its the staying a sleep , states that melatonin does not work for her , feels her symptoms the most in the morning    07/03/2022 Patient states still cant taste or smell , feels pressurish in her sinuses    07/17/2022 Patient states   Concussion HPI:  - Injury date: 05/30/2022   - Mechanism of injury: fall   - LOC: yes   - Initial evaluation: ED  - Previous head injuries/concussions: no   - Previous imaging: yes  for headaches     - Social history: work    Hospitalization for head injury? No Diagnosed/treated for headache disorder or migraines? No Diagnosed with learning disability [name]? No Diagnosed with ADD/ADHD? No Diagnose with Depression, anxiety, or other Psychiatric Disorder? Yes    Current medications:  Current Outpatient Medications  Medication Sig Dispense Refill   cyclobenzaprine (FLEXERIL) 10 MG tablet Take 10 mg by mouth 3 (three) times daily as needed for muscle  spasms.     cyclobenzaprine (FLEXERIL) 5 MG tablet Take 1 tablet (5 mg total) by mouth 3 (three) times daily as needed for muscle spasms. 12 tablet 0   ibuprofen (ADVIL) 200 MG tablet Take 400 mg by mouth every 6 (six) hours as needed.     meloxicam (MOBIC) 15 MG tablet Take 1 tablet (15 mg total) by mouth daily. 30 tablet 0   tamoxifen (NOLVADEX) 20 MG tablet TAKE 1 TABLET BY MOUTH EVERY DAY 90 tablet 2   traZODone (DESYREL) 50 MG tablet TAKE 1 TABLET BY MOUTH NIGHTLY AS NEEDED FOR  SLEEP     No current facility-administered medications for this visit.      Objective:     There were no vitals filed for this visit.    There is no height or weight on file to calculate BMI.    Physical Exam:     General: Well-appearing, cooperative, sitting comfortably in no acute distress.  Psychiatric: Mood and affect are appropriate.   Neuro:sensation intact and strength 5/5 with no deficits, no atrophy, normal muscle tone   Today's Symptom Severity Score:  Scores: 0-6  Headache:*** "Pressure in head":***  Neck Pain:*** Nausea or vomiting:*** Dizziness:*** Blurred vision:*** Balance problems:*** Sensitivity to light:*** Sensitivity to noise:*** Feeling slowed down:*** Feeling like "in a fog":*** "Don't feel right":*** Difficulty concentrating:*** Difficulty remembering:***  Fatigue or low energy:*** Confusion:***  Drowsiness:***  More emotional:*** Irritability:*** Sadness:***  Nervous or Anxious:*** Trouble falling or staying asleep:***  Total number of symptoms: ***/22  Symptom Severity index: ***/132  Worse with physical activity? No*** Worse with mental activity? No*** Percent improved since injury: ***%    Full pain-free cervical PROM: yes***    Cognitive:  - Months backwards: *** Mistakes. *** seconds  mVOMS:   - Baseline symptoms: *** - Horizontal Vestibular-Ocular Reflex: ***/10  - Smooth pursuits: ***/10  - Horizontal Saccades:  ***/10  - Visual Motion Sensitivity Test:  ***/10  - Convergence: ***cm (<5 cm normal)    Autonomic:  - Symptomatic with supine to standing: No***  Complex Tandem Gait: - Forward, eyes open: *** errors - Backward, eyes open: *** errors - Forward, eyes closed: *** errors - Backward, eyes closed: *** errors  Electronically signed by:  Penny Acosta D.[name] 7:43 AM 07/16/22

## 2022-07-17 ENCOUNTER — Ambulatory Visit: Payer: BC Managed Care – PPO | Admitting: Sports Medicine

## 2022-07-17 ENCOUNTER — Ambulatory Visit
Admission: RE | Admit: 2022-07-17 | Discharge: 2022-07-17 | Disposition: A | Discharge status: Home / Self Care | Payer: BC Managed Care – PPO | Source: Ambulatory Visit | Attending: Oncology | Admitting: Oncology

## 2022-07-17 VITALS — BP 118/80 | HR 98 | Ht 62.0 in | Wt 153.0 lb

## 2022-07-17 DIAGNOSIS — I609 Nontraumatic subarachnoid hemorrhage, unspecified: Secondary | ICD-10-CM

## 2022-07-17 DIAGNOSIS — R430 Anosmia: Secondary | ICD-10-CM

## 2022-07-17 DIAGNOSIS — G4700 Insomnia, unspecified: Secondary | ICD-10-CM | POA: Diagnosis not present

## 2022-07-17 DIAGNOSIS — R432 Parageusia: Secondary | ICD-10-CM

## 2022-07-17 DIAGNOSIS — R270 Ataxia, unspecified: Secondary | ICD-10-CM

## 2022-07-17 DIAGNOSIS — S02119A Unspecified fracture of occiput, initial encounter for closed fracture: Secondary | ICD-10-CM | POA: Diagnosis not present

## 2022-07-17 DIAGNOSIS — D0502 Lobular carcinoma in situ of left breast: Secondary | ICD-10-CM | POA: Insufficient documentation

## 2022-07-17 DIAGNOSIS — R55 Syncope and collapse: Secondary | ICD-10-CM

## 2022-07-17 DIAGNOSIS — R4189 Other symptoms and signs involving cognitive functions and awareness: Secondary | ICD-10-CM

## 2022-07-17 DIAGNOSIS — S060X0A Concussion without loss of consciousness, initial encounter: Secondary | ICD-10-CM | POA: Diagnosis not present

## 2022-07-17 MED ORDER — AMANTADINE HCL 100 MG PO CAPS: 100.0000 mg | ORAL_CAPSULE | Freq: Two times a day (BID) | ORAL | 0 refills | Status: DC

## 2022-07-17 MED ORDER — GADOBUTROL 1 MMOL/ML IV SOLN
6.0000 mL | Freq: Once | INTRAVENOUS | Status: AC | PRN
  Administered 2022-07-17: 6 mL via INTRAVENOUS

## 2022-07-17 NOTE — Patient Instructions (Addendum)
Good to see you  Calling vestibular therapy  Start amantadine 100 mg twice a day for 2 months  Can increase to 8 hours maximum at work  2 week follow up

## 2022-07-30 NOTE — Progress Notes (Unsigned)
Benito Mccreedy D.[place] Phone: [phone]  Assessment and Plan:     There are no diagnoses linked to this encounter.  ***    Date of injury was 05/30/2022. Symptom severity scores of *** and *** today. Original symptom severity scores were 22 and 110. The patient was counseled on the nature of the injury, typical course and potential options for further evaluation and treatment. Discussed the importance of compliance with recommendations. Patient stated understanding of this plan and willingness to comply.  Recommendations:  -  Relative mental and physical rest for 48 hours after concussive event - Recommend light aerobic activity while keeping symptoms less than 3/10 - Stop mental or physical activities that cause symptoms to worsen greater than 3/10, and wait 24 hours before attempting them again - Eliminate screen time as much as possible for first 48 hours after concussive event, then continue limited screen time (recommend less than 2 hours per day)   - Encouraged to RTC in *** for reassessment or sooner for any concerns or acute changes   Pertinent previous records reviewed include ***   Time of visit *** minutes, which included chart review, physical exam, treatment plan, symptom severity score, VOMS, and tandem gait testing being performed, interpreted, and discussed with patient at today's visit.   Subjective:   I, [name], am serving as a Education administrator for Doctor Glennon Mac   Chief Complaint: concussion symptoms    HPI:    06/05/22 Patient is a 51 year old female complaining of concussion symptoms. Patient states  head injury that occurred this morning.  She states that she will frequently get lightheaded from time to time particularly in the morning if she stands up too quickly.  States that she typically takes her time getting out of bed but this morning was in a hurry got up felt lightheaded fell  and hit the ground.  Husband was in the room was awoken by her falling and hitting the ground.  She hit the back of her head is complaining of some neck discomfort.  No other pain.  Reportedly was bit confused after the injury.  She was able to ambulate to the car and into the ER.  No reported seizure-like activity no numbness or tingling.  Does have mild headache.  No blurry vision   06/12/2022 Patient states that she does feel better  wants to know if she can take trazodone cause her insomnia is cranked up , can fall a sleep its the staying a sleep , states that melatonin does not work for her , feels her symptoms the most in the morning    07/03/2022 Patient states still cant taste or smell , feels pressurish in her sinuses    07/17/2022 Patient states that she is better    07/31/2022 Patient states   Concussion HPI:  - Injury date: 05/30/2022   - Mechanism of injury: fall   - LOC: yes   - Initial evaluation: ED  - Previous head injuries/concussions: no   - Previous imaging: yes  for headaches     - Social history: work    Hospitalization for head injury? No Diagnosed/treated for headache disorder or migraines? No Diagnosed with learning disability [name]? No Diagnosed with ADD/ADHD? No Diagnose with Depression, anxiety, or other Psychiatric Disorder? Yes    Current medications:  Current Outpatient Medications  Medication Sig Dispense Refill   amantadine (SYMMETREL) 100 MG capsule Take 1 capsule (  100 mg total) by mouth 2 (two) times daily. 120 capsule 0   cyclobenzaprine (FLEXERIL) 10 MG tablet Take 10 mg by mouth 3 (three) times daily as needed for muscle spasms.     cyclobenzaprine (FLEXERIL) 5 MG tablet Take 1 tablet (5 mg total) by mouth 3 (three) times daily as needed for muscle spasms. 12 tablet 0   ibuprofen (ADVIL) 200 MG tablet Take 400 mg by mouth every 6 (six) hours as needed.     meloxicam (MOBIC) 15 MG tablet Take 1 tablet (15 mg total) by mouth daily. 30 tablet 0    tamoxifen (NOLVADEX) 20 MG tablet TAKE 1 TABLET BY MOUTH EVERY DAY 90 tablet 2   traZODone (DESYREL) 50 MG tablet TAKE 1 TABLET BY MOUTH NIGHTLY AS NEEDED FOR SLEEP     No current facility-administered medications for this visit.      Objective:     There were no vitals filed for this visit.    There is no height or weight on file to calculate BMI.    Physical Exam:     General: Well-appearing, cooperative, sitting comfortably in no acute distress.  Psychiatric: Mood and affect are appropriate.   Neuro:sensation intact and strength 5/5 with no deficits, no atrophy, normal muscle tone   Today's Symptom Severity Score:  Scores: 0-6  Headache:*** "Pressure in head":***  Neck Pain:*** Nausea or vomiting:*** Dizziness:*** Blurred vision:*** Balance problems:*** Sensitivity to light:*** Sensitivity to noise:*** Feeling slowed down:*** Feeling like "in a fog":*** "Don't feel right":*** Difficulty concentrating:*** Difficulty remembering:***  Fatigue or low energy:*** Confusion:***  Drowsiness:***  More emotional:*** Irritability:*** Sadness:***  Nervous or Anxious:*** Trouble falling or staying asleep:***  Total number of symptoms: ***/22  Symptom Severity index: ***/132  Worse with physical activity? No*** Worse with mental activity? No*** Percent improved since injury: ***%    Full pain-free cervical PROM: yes***    Cognitive:  - Months backwards: *** Mistakes. *** seconds  mVOMS:   - Baseline symptoms: *** - Horizontal Vestibular-Ocular Reflex: ***/10  - Smooth pursuits: ***/10  - Horizontal Saccades:  ***/10  - Visual Motion Sensitivity Test:  ***/10  - Convergence: ***cm (<5 cm normal)    Autonomic:  - Symptomatic with supine to standing: No***  Complex Tandem Gait: - Forward, eyes open: *** errors - Backward, eyes open: *** errors - Forward, eyes closed: *** errors - Backward, eyes closed: *** errors  Electronically signed by:  Benito Mccreedy  D.[name] 3:53 PM 07/30/22

## 2022-07-31 ENCOUNTER — Ambulatory Visit: Payer: BC Managed Care – PPO | Admitting: Sports Medicine

## 2022-07-31 VITALS — BP 110/78 | HR 91 | Ht 62.0 in | Wt 156.0 lb

## 2022-07-31 DIAGNOSIS — R270 Ataxia, unspecified: Secondary | ICD-10-CM

## 2022-07-31 DIAGNOSIS — S02119A Unspecified fracture of occiput, initial encounter for closed fracture: Secondary | ICD-10-CM | POA: Diagnosis not present

## 2022-07-31 DIAGNOSIS — S060X0A Concussion without loss of consciousness, initial encounter: Secondary | ICD-10-CM | POA: Diagnosis not present

## 2022-07-31 DIAGNOSIS — R432 Parageusia: Secondary | ICD-10-CM

## 2022-07-31 DIAGNOSIS — I609 Nontraumatic subarachnoid hemorrhage, unspecified: Secondary | ICD-10-CM

## 2022-07-31 DIAGNOSIS — R55 Syncope and collapse: Secondary | ICD-10-CM | POA: Diagnosis not present

## 2022-07-31 DIAGNOSIS — R430 Anosmia: Secondary | ICD-10-CM

## 2022-07-31 DIAGNOSIS — G4700 Insomnia, unspecified: Secondary | ICD-10-CM

## 2022-07-31 DIAGNOSIS — R4189 Other symptoms and signs involving cognitive functions and awareness: Secondary | ICD-10-CM

## 2022-07-31 NOTE — Patient Instructions (Addendum)
Good to see you Continue amantadine  Can establish with vestibular PT  8 hours maximum a day with rest breaks as needed 1 month  4 week follow up

## 2022-08-05 ENCOUNTER — Other Ambulatory Visit: Payer: Self-pay | Admitting: *Deleted

## 2022-08-05 MED ORDER — TAMOXIFEN CITRATE 20 MG PO TABS: 20.0000 mg | ORAL_TABLET | Freq: Every day | ORAL | 2 refills | Status: DC

## 2022-08-28 ENCOUNTER — Encounter: Payer: Self-pay | Admitting: Obstetrics & Gynecology

## 2022-08-28 ENCOUNTER — Ambulatory Visit (INDEPENDENT_AMBULATORY_CARE_PROVIDER_SITE_OTHER): Payer: BC Managed Care – PPO | Admitting: Obstetrics & Gynecology

## 2022-08-28 VITALS — BP 110/68 | Ht 62.0 in | Wt 160.0 lb

## 2022-08-28 DIAGNOSIS — N951 Menopausal and female climacteric states: Secondary | ICD-10-CM | POA: Diagnosis not present

## 2022-08-28 DIAGNOSIS — D0502 Lobular carcinoma in situ of left breast: Secondary | ICD-10-CM | POA: Diagnosis not present

## 2022-08-28 DIAGNOSIS — R350 Frequency of micturition: Secondary | ICD-10-CM

## 2022-08-28 DIAGNOSIS — Z01419 Encounter for gynecological examination (general) (routine) without abnormal findings: Secondary | ICD-10-CM | POA: Diagnosis not present

## 2022-08-28 DIAGNOSIS — Z789 Other specified health status: Secondary | ICD-10-CM

## 2022-08-28 LAB — URINALYSIS, COMPLETE W/RFL CULTURE
Bacteria, UA: NONE SEEN /HPF
Bilirubin Urine: NEGATIVE
Glucose, UA: NEGATIVE
Hgb urine dipstick: NEGATIVE
Hyaline Cast: NONE SEEN /LPF
Ketones, ur: NEGATIVE
Leukocyte Esterase: NEGATIVE
Nitrites, Initial: NEGATIVE
Protein, ur: NEGATIVE
RBC / HPF: NONE SEEN /HPF (ref 0–2)
Specific Gravity, Urine: 1.004 (ref 1.001–1.035)
WBC, UA: NONE SEEN /HPF (ref 0–5)
pH: 6.5 (ref 5.0–8.0)

## 2022-08-28 LAB — NO CULTURE INDICATED

## 2022-08-28 NOTE — Progress Notes (Signed)
Penny Acosta [birth year] [id number]   History:    51 y.o.  G2P2L2   RP:  Established patient presenting for annual gyn exam    HPI: Oligomenorrhea.  No pelvic pain.  No pain with IC. Using condoms.  Pap smear Neg 08/2021.  No history of abnormal Pap smears. No indication to repeat a Pap at this time.  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  Followed by oncology, determined that she does not need radiation.  On Tamoxifen x 2 years. Bilateral breast MRI Neg 08-03-2022. Mother passed away from Pancreatic Cancer this year.  Patient will discuss screening for herself with her Oncologist.  BD Normal 06/16/22.  BMI improved to 29.26.  Colonoscopy 11/2019.  Health labs Fam MD at [facility]. Pt declines flu vaccine.   Past medical history,surgical history, family history and social history were all reviewed and documented in the EPIC chart.  Gynecologic History No LMP recorded. Patient is perimenopausal.  Obstetric History OB History  Gravida Para Term Preterm AB Living  2 2     0 2  SAB IAB Ectopic Multiple Live Births               # Outcome Date GA Lbr Len/2nd Weight Sex Delivery Anes PTL Lv  2 Para           1 Para              ROS: A ROS was performed and pertinent positives and negatives are included in the history. GENERAL: No fevers or chills. HEENT: No change in vision, no earache, sore throat or sinus congestion. NECK: No pain or stiffness. CARDIOVASCULAR: No chest pain or pressure. No palpitations. PULMONARY: No shortness of breath, cough or wheeze. GASTROINTESTINAL: No abdominal pain, nausea, vomiting or diarrhea, melena or bright red blood per rectum. GENITOURINARY: No urinary frequency, urgency, hesitancy or dysuria. MUSCULOSKELETAL: No joint or muscle pain, no back pain, no recent trauma. DERMATOLOGIC: No rash, no itching, no lesions. ENDOCRINE: No polyuria, polydipsia, no heat or cold intolerance. No recent change in weight. HEMATOLOGICAL: No anemia or easy bruising or  bleeding. NEUROLOGIC: No headache, seizures, numbness, tingling or weakness. PSYCHIATRIC: No depression, no loss of interest in normal activity or change in sleep pattern.     Exam:   BP 110/68 (BP Location: Right Arm, Patient Position: Sitting, Cuff Size: Normal)   Ht '5\' 2"'$  (1.575 m)   Wt 160 lb (72.6 kg)   BMI 29.26 kg/m   Body mass index is 29.26 kg/m.  General appearance : Well developed well nourished female. No acute distress HEENT: Eyes: no retinal hemorrhage or exudates,  Neck supple, trachea midline, no carotid bruits, no thyroidmegaly Lungs: Clear to auscultation, no rhonchi or wheezes, or rib retractions  Heart: Regular rate and rhythm, no murmurs or gallops Breast:Examined in sitting and supine position were symmetrical in appearance, no palpable masses or tenderness,  no skin retraction, no nipple inversion, no nipple discharge, no skin discoloration, no axillary or supraclavicular lymphadenopathy Abdomen: no palpable masses or tenderness, no rebound or guarding Extremities: no edema or skin discoloration or tenderness  Pelvic: Vulva: Normal             Vagina: No gross lesions or discharge  Cervix: No gross lesions or discharge  Uterus  AV, normal size, shape and consistency, non-tender and mobile  Adnexa  Without masses or tenderness  Anus: Normal  U/a completely Negative   Assessment/Plan:  51 y.o. female for annual exam  1. Well female exam with routine gynecological exam Oligomenorrhea.  No pelvic pain.  No pain with IC. Using condoms.  Pap smear Neg 08/2021.  No history of abnormal Pap smears. No indication to repeat a Pap at this time.  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  Followed by oncology, determined that she does not need radiation.  On Tamoxifen x 2 years. Bilateral breast MRI Neg 12-Aug-2022. Mother passed away from Pancreatic Cancer this year.  Patient will discuss screening for herself with her Oncologist.  BD Normal 06/16/22.  BMI improved to  29.26.  Colonoscopy 11/2019.  Health labs Fam MD at [facility]. Pt declines flu vaccine.   2. Perimenopause Oligomenorrhea.  No pelvic pain.  No pain with IC. Using condoms. BD normal 06/2022.  3. Use of condoms for contraception  4. 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  5. Urinary frequency U/A completely Negative. - Urinalysis,Complete w/RFL Culture  Other orders - Dulaglutide 0.75 MG/0.5ML SOPN; Inject into the skin. - REFLEXIVE URINE CULTURE   Princess Bruins MD, 4:10 PM

## 2022-09-03 NOTE — Progress Notes (Unsigned)
Benito Mccreedy D.[place] Phone: [phone]  Assessment and Plan:     There are no diagnoses linked to this encounter.  ***    Date of injury was 05/30/2022. Symptom severity scores of *** and *** today. Original symptom severity scores were 22 and 110. The patient was counseled on the nature of the injury, typical course and potential options for further evaluation and treatment. Discussed the importance of compliance with recommendations. Patient stated understanding of this plan and willingness to comply.  Recommendations:  -  Relative mental and physical rest for 48 hours after concussive event - Recommend light aerobic activity while keeping symptoms less than 3/10 - Stop mental or physical activities that cause symptoms to worsen greater than 3/10, and wait 24 hours before attempting them again - Eliminate screen time as much as possible for first 48 hours after concussive event, then continue limited screen time (recommend less than 2 hours per day)   - Encouraged to RTC in *** for reassessment or sooner for any concerns or acute changes   Pertinent previous records reviewed include ***   Time of visit *** minutes, which included chart review, physical exam, treatment plan, symptom severity score, VOMS, and tandem gait testing being performed, interpreted, and discussed with patient at today's visit.   Subjective:   I, [name], am serving as a Education administrator for Doctor Glennon Mac   Chief Complaint: concussion symptoms    HPI:    06/05/22 Patient is a 51 year old female complaining of concussion symptoms. Patient states  head injury that occurred this morning.  She states that she will frequently get lightheaded from time to time particularly in the morning if she stands up too quickly.  States that she typically takes her time getting out of bed but this morning was in a hurry got up felt lightheaded fell  and hit the ground.  Husband was in the room was awoken by her falling and hitting the ground.  She hit the back of her head is complaining of some neck discomfort.  No other pain.  Reportedly was bit confused after the injury.  She was able to ambulate to the car and into the ER.  No reported seizure-like activity no numbness or tingling.  Does have mild headache.  No blurry vision   06/12/2022 Patient states that she does feel better  wants to know if she can take trazodone cause her insomnia is cranked up , can fall a sleep its the staying a sleep , states that melatonin does not work for her , feels her symptoms the most in the morning    07/03/2022 Patient states still cant taste or smell , feels pressurish in her sinuses    07/17/2022 Patient states that she is better    07/31/2022 Patient states that she Is good   09/04/2022 Patient states    Concussion HPI:  - Injury date: 05/30/2022   - Mechanism of injury: fall   - LOC: yes   - Initial evaluation: ED  - Previous head injuries/concussions: no   - Previous imaging: yes  for headaches     - Social history: work    Hospitalization for head injury? No Diagnosed/treated for headache disorder or migraines? No Diagnosed with learning disability [name]? No Diagnosed with ADD/ADHD? No Diagnose with Depression, anxiety, or other Psychiatric Disorder? Yes    Current medications:  Current Outpatient Medications  Medication Sig Dispense Refill  amantadine (SYMMETREL) 100 MG capsule Take 1 capsule (100 mg total) by mouth 2 (two) times daily. 120 capsule 0   cyclobenzaprine (FLEXERIL) 10 MG tablet Take 10 mg by mouth 3 (three) times daily as needed for muscle spasms.     Dulaglutide 0.75 MG/0.5ML SOPN Inject into the skin.     ibuprofen (ADVIL) 200 MG tablet Take 400 mg by mouth every 6 (six) hours as needed.     tamoxifen (NOLVADEX) 20 MG tablet Take 1 tablet (20 mg total) by mouth daily. 90 tablet 2   traZODone (DESYREL) 50 MG  tablet TAKE 1 TABLET BY MOUTH NIGHTLY AS NEEDED FOR SLEEP     No current facility-administered medications for this visit.      Objective:     There were no vitals filed for this visit.    There is no height or weight on file to calculate BMI.    Physical Exam:     General: Well-appearing, cooperative, sitting comfortably in no acute distress.  Psychiatric: Mood and affect are appropriate.   Neuro:sensation intact and strength 5/5 with no deficits, no atrophy, normal muscle tone   Today's Symptom Severity Score:  Scores: 0-6  Headache:*** "Pressure in head":***  Neck Pain:*** Nausea or vomiting:*** Dizziness:*** Blurred vision:*** Balance problems:*** Sensitivity to light:*** Sensitivity to noise:*** Feeling slowed down:*** Feeling like "in a fog":*** "Don't feel right":*** Difficulty concentrating:*** Difficulty remembering:***  Fatigue or low energy:*** Confusion:***  Drowsiness:***  More emotional:*** Irritability:*** Sadness:***  Nervous or Anxious:*** Trouble falling or staying asleep:***  Total number of symptoms: ***/22  Symptom Severity index: ***/132  Worse with physical activity? No*** Worse with mental activity? No*** Percent improved since injury: ***%    Full pain-free cervical PROM: yes***    Cognitive:  - Months backwards: *** Mistakes. *** seconds  mVOMS:   - Baseline symptoms: *** - Horizontal Vestibular-Ocular Reflex: ***/10  - Smooth pursuits: ***/10  - Horizontal Saccades:  ***/10  - Visual Motion Sensitivity Test:  ***/10  - Convergence: ***cm (<5 cm normal)    Autonomic:  - Symptomatic with supine to standing: No***  Complex Tandem Gait: - Forward, eyes open: *** errors - Backward, eyes open: *** errors - Forward, eyes closed: *** errors - Backward, eyes closed: *** errors  Electronically signed by:  Benito Mccreedy D.[name] 12:09 PM 09/03/22

## 2022-09-04 ENCOUNTER — Ambulatory Visit: Payer: BC Managed Care – PPO | Admitting: Sports Medicine

## 2022-09-04 VITALS — BP 110/62 | HR 90 | Ht 62.0 in | Wt 156.0 lb

## 2022-09-04 DIAGNOSIS — G4700 Insomnia, unspecified: Secondary | ICD-10-CM

## 2022-09-04 DIAGNOSIS — R4189 Other symptoms and signs involving cognitive functions and awareness: Secondary | ICD-10-CM | POA: Diagnosis not present

## 2022-09-04 DIAGNOSIS — S02119A Unspecified fracture of occiput, initial encounter for closed fracture: Secondary | ICD-10-CM | POA: Diagnosis not present

## 2022-09-04 DIAGNOSIS — I609 Nontraumatic subarachnoid hemorrhage, unspecified: Secondary | ICD-10-CM | POA: Diagnosis not present

## 2022-09-04 DIAGNOSIS — S060X0A Concussion without loss of consciousness, initial encounter: Secondary | ICD-10-CM | POA: Diagnosis not present

## 2022-09-04 NOTE — Patient Instructions (Signed)
Good to see you   

## 2022-09-07 ENCOUNTER — Other Ambulatory Visit: Payer: Self-pay | Admitting: Sports Medicine

## 2022-09-12 NOTE — Progress Notes (Unsigned)
    Penny Acosta D.[place] Phone: [phone]   Assessment and Plan:     There are no diagnoses linked to this encounter.  ***   Pertinent previous records reviewed include ***   Follow Up: ***     Subjective:   I, Penny Acosta, am serving as a Education administrator for Doctor Glennon Mac  Chief Complaint: neck pain   HPI:   09/16/2022 Patient is a 51 year old female complaining of neck pain. Patient states   Relevant Historical Information: ***  Additional pertinent review of systems negative.   Current Outpatient Medications:    amantadine (SYMMETREL) 100 MG capsule, Take 1 capsule (100 mg total) by mouth 2 (two) times daily., Disp: 120 capsule, Rfl: 0   cyclobenzaprine (FLEXERIL) 10 MG tablet, Take 10 mg by mouth 3 (three) times daily as needed for muscle spasms., Disp: , Rfl:    Dulaglutide 0.75 MG/0.5ML SOPN, Inject into the skin., Disp: , Rfl:    ibuprofen (ADVIL) 200 MG tablet, Take 400 mg by mouth every 6 (six) hours as needed., Disp: , Rfl:    tamoxifen (NOLVADEX) 20 MG tablet, Take 1 tablet (20 mg total) by mouth daily., Disp: 90 tablet, Rfl: 2   traZODone (DESYREL) 50 MG tablet, TAKE 1 TABLET BY MOUTH NIGHTLY AS NEEDED FOR SLEEP, Disp: , Rfl:    Objective:     There were no vitals filed for this visit.    There is no height or weight on file to calculate BMI.    Physical Exam:    ***   Electronically signed by:  Penny Acosta D.[name] 8:07 AM 09/12/22

## 2022-09-16 ENCOUNTER — Ambulatory Visit: Payer: BC Managed Care – PPO | Admitting: Sports Medicine

## 2022-09-16 VITALS — BP 120/80 | HR 80 | Ht 62.0 in | Wt 155.0 lb

## 2022-09-16 DIAGNOSIS — M5030 Other cervical disc degeneration, unspecified cervical region: Secondary | ICD-10-CM

## 2022-09-16 DIAGNOSIS — M542 Cervicalgia: Secondary | ICD-10-CM

## 2022-09-16 DIAGNOSIS — G44209 Tension-type headache, unspecified, not intractable: Secondary | ICD-10-CM

## 2022-09-16 MED ORDER — MELOXICAM 15 MG PO TABS: 15.0000 mg | ORAL_TABLET | Freq: Every day | ORAL | 0 refills | Status: DC

## 2022-09-16 NOTE — Patient Instructions (Signed)
Good to see you  PT referral  - Start meloxicam 15 mg daily x2 weeks.  If still having pain after 2 weeks, complete 3rd-week of meloxicam. May use remaining meloxicam as needed once daily for pain control.  Do not to use additional NSAIDs while taking meloxicam.  May use Tylenol [phone] mg 2 to 3 times a day for breakthrough pain. Neck HEP 4 week follow up

## 2022-10-13 ENCOUNTER — Other Ambulatory Visit: Payer: Self-pay | Admitting: Sports Medicine

## 2022-10-23 NOTE — Progress Notes (Signed)
Penny Acosta D.[place] Phone: [phone]   Assessment and Plan:     1. Neck pain 2. Tension headache 3. DDD (degenerative disc disease), cervical 4. Somatic dysfunction of cervical region 5. Somatic dysfunction of thoracic region 6. Somatic dysfunction of lumbar region  -Chronic with exacerbation, subsequent visit - Recurrent flare of pain primarily in neck yesterday after several virtual meetings with history of chronic neck pain likely related to work ergonomics - Patient had some relief with meloxicam.  Discontinue meloxicam at this time and may use remainder as needed - Patient had benefit from physical therapy and dry needling.  Recommend continuing HEP, physical therapy and dry needling - Patient has received significant relief with OMT in the past.  Elects for repeat OMT today.  Tolerated well per note below. - Decision today to treat with OMT was based on Physical Exam  After verbal consent patient was treated with HVLA (high velocity low amplitude), ME (muscle energy), FPR (flex positional release), ST (soft tissue),  techniques in cervical, thoracic, lumbar,  Patient tolerated the procedure well with improvement in symptoms.  Patient educated on potential side effects of soreness and recommended to rest, hydrate, and use Tylenol as needed for pain control.   Pertinent previous records reviewed include none   Follow Up: 3 to 4 weeks for reevaluation.  If no improvement or worsening of symptoms, could consider repeat OMT versus trigger point injections   Subjective:   I, Penny Acosta, am serving as a Education administrator for Penny Acosta   Chief Complaint: neck pain    HPI:    09/16/2022 Patient is a 52 year old female complaining of neck pain. Patient states that she has had neck pain for years , has done massage, yoga, acupressure, chronic headaches all the time to the point she gets nauseas ,  just wants help, felt better when she was on meloxicam some times numbness tingling down her right side , decreased ROM, radiating pain her neck and her spine    10/24/2022 Patient states neck isnt doing great , PT today     Relevant Historical Information: Recently recovered from concussion, history of breast cancer  Additional pertinent review of systems negative.   Current Outpatient Medications:    amantadine (SYMMETREL) 100 MG capsule, Take 1 capsule (100 mg total) by mouth 2 (two) times daily., Disp: 120 capsule, Rfl: 0   cyclobenzaprine (FLEXERIL) 10 MG tablet, Take 10 mg by mouth 3 (three) times daily as needed for muscle spasms., Disp: , Rfl:    Dulaglutide 0.75 MG/0.5ML SOPN, Inject into the skin., Disp: , Rfl:    ibuprofen (ADVIL) 200 MG tablet, Take 400 mg by mouth every 6 (six) hours as needed., Disp: , Rfl:    meloxicam (MOBIC) 15 MG tablet, Take 1 tablet (15 mg total) by mouth daily., Disp: 30 tablet, Rfl: 0   tamoxifen (NOLVADEX) 20 MG tablet, Take 1 tablet (20 mg total) by mouth daily., Disp: 90 tablet, Rfl: 2   traZODone (DESYREL) 50 MG tablet, TAKE 1 TABLET BY MOUTH NIGHTLY AS NEEDED FOR SLEEP, Disp: , Rfl:    Objective:     Vitals:   10/24/22 0748  BP: 118/78  Pulse: (!) 103  SpO2: 100%  Weight: 155 lb (70.3 kg)  Height: 5' 2"$  (1.575 m)      Body mass index is 28.35 kg/m.    Physical Exam:    General: Well-appearing, cooperative, sitting  comfortably in no acute distress.   OMT Physical Exam:   Cervical: TTP paraspinal, C5-7 RRSR Thoracic: TTP paraspinal, T4-7 RLSR, T8-11 RRSL Lumbar: TTP paraspinal, L2 RLSL  Neck ROM: Full active ROM, with palpable, currently nonpainful, and audible grinding and clicking most consistent with tight cervical paraspinal musculature snapping over each other and cervical spine TTP: Bilateral cervical paraspinal, bilateral trapezius NTTP: cervical spinous processes,   thoracic paraspinal,      Electronically signed by:   [facility] 8:17 AM 10/24/22

## 2022-10-24 ENCOUNTER — Ambulatory Visit: Payer: BC Managed Care – PPO | Admitting: Sports Medicine

## 2022-10-24 ENCOUNTER — Other Ambulatory Visit: Payer: Self-pay | Admitting: General Surgery

## 2022-10-24 VITALS — BP 118/78 | HR 103 | Ht 62.0 in | Wt 155.0 lb

## 2022-10-24 DIAGNOSIS — M9901 Segmental and somatic dysfunction of cervical region: Secondary | ICD-10-CM

## 2022-10-24 DIAGNOSIS — M542 Cervicalgia: Secondary | ICD-10-CM | POA: Diagnosis not present

## 2022-10-24 DIAGNOSIS — G44209 Tension-type headache, unspecified, not intractable: Secondary | ICD-10-CM | POA: Diagnosis not present

## 2022-10-24 DIAGNOSIS — Z9189 Other specified personal risk factors, not elsewhere classified: Secondary | ICD-10-CM

## 2022-10-24 DIAGNOSIS — M5030 Other cervical disc degeneration, unspecified cervical region: Secondary | ICD-10-CM

## 2022-10-24 DIAGNOSIS — Z86000 Personal history of in-situ neoplasm of breast: Secondary | ICD-10-CM

## 2022-10-24 DIAGNOSIS — M9902 Segmental and somatic dysfunction of thoracic region: Secondary | ICD-10-CM

## 2022-10-24 DIAGNOSIS — M9903 Segmental and somatic dysfunction of lumbar region: Secondary | ICD-10-CM

## 2022-10-24 NOTE — Patient Instructions (Addendum)
Good to see you  Discontinue daily meloxicam can use remainder as needed  Continue HEP and PT  3 week follow up

## 2022-11-13 NOTE — Progress Notes (Signed)
Benito Mccreedy D.[place] Phone: [phone]   Assessment and Plan:     1. Neck pain 2. Tension headache 3. DDD (degenerative disc disease), cervical 4. Chronic bilateral thoracic back pain  -Chronic with exacerbation, subsequent visit - Overall mild improvement with regular OMT, physical therapy, starting dry needling, though patient continues to have tension headaches and pain through neck, upper back, traps bilaterally - Patient elected for trigger point injections.  Tolerated well per note below.  Trigger Point Injection: After informed consent was obtained, skin cleaned with alcohol  prep.  A total of 8 trigger points identified along cervical paraspinal, thoracic paraspinal, trapezius bilaterally.  Injections given over area of pain for total injection of 7 ml lidocaine 1% w/o epi and 1 mL Kenalog 40 mg/ML.  Patient had relief after the injection without side effects.  Pt given signs of infection to watch for.   Pertinent previous records reviewed include none   Follow Up: 3 to 4 weeks for reevaluation.  Could consider repeat trigger point injections versus OMT   Subjective:   I, Moenique Parris, am serving as a Education administrator for Doctor Glennon Mac   Chief Complaint: neck pain    HPI:    09/16/2022 Patient is a 52 year old female complaining of neck pain. Patient states that she has had neck pain for years , has done massage, yoga, acupressure, chronic headaches all the time to the point she gets nauseas , just wants help, felt better when she was on meloxicam some times numbness tingling down her right side , decreased ROM, radiating pain her neck and her spine    10/24/2022 Patient states neck isnt doing great , PT today    11/14/2022 Patient states she is a little bit better, dry needling has been helping but it after a couple of days it tightens up     Relevant Historical Information: Recently  recovered from concussion, history of breast cancer    Additional pertinent review of systems negative.   Current Outpatient Medications:    amantadine (SYMMETREL) 100 MG capsule, Take 1 capsule (100 mg total) by mouth 2 (two) times daily., Disp: 120 capsule, Rfl: 0   cyclobenzaprine (FLEXERIL) 10 MG tablet, Take 10 mg by mouth 3 (three) times daily as needed for muscle spasms., Disp: , Rfl:    Dulaglutide 0.75 MG/0.5ML SOPN, Inject into the skin., Disp: , Rfl:    ibuprofen (ADVIL) 200 MG tablet, Take 400 mg by mouth every 6 (six) hours as needed., Disp: , Rfl:    meloxicam (MOBIC) 15 MG tablet, Take 1 tablet (15 mg total) by mouth daily., Disp: 30 tablet, Rfl: 0   tamoxifen (NOLVADEX) 20 MG tablet, Take 1 tablet (20 mg total) by mouth daily., Disp: 90 tablet, Rfl: 2   traZODone (DESYREL) 50 MG tablet, TAKE 1 TABLET BY MOUTH NIGHTLY AS NEEDED FOR SLEEP, Disp: , Rfl:    Objective:     Vitals:   11/14/22 0747  BP: 126/80  Pulse: 81  SpO2: 99%  Weight: 155 lb (70.3 kg)  Height: '5\' 2"'$  (1.575 m)      Body mass index is 28.35 kg/m.    Physical Exam:     General: Well-appearing, cooperative, sitting comfortably in no acute distress.     Neck ROM: Full active ROM, with palpable, currently nonpainful, and audible grinding and clicking most consistent with tight cervical paraspinal musculature  TTP: Bilateral cervical paraspinal, bilateral  trapezius, lower  thoracic paraspinal,   NTTP: cervical spinous processes,     Electronically signed by:  Benito Mccreedy D.[name] 8:13 AM 11/14/22

## 2022-11-14 ENCOUNTER — Ambulatory Visit: Payer: BC Managed Care – PPO | Admitting: Sports Medicine

## 2022-11-14 VITALS — BP 126/80 | HR 81 | Ht 62.0 in | Wt 155.0 lb

## 2022-11-14 DIAGNOSIS — M546 Pain in thoracic spine: Secondary | ICD-10-CM | POA: Diagnosis not present

## 2022-11-14 DIAGNOSIS — M542 Cervicalgia: Secondary | ICD-10-CM

## 2022-11-14 DIAGNOSIS — G8929 Other chronic pain: Secondary | ICD-10-CM

## 2022-11-14 DIAGNOSIS — G44209 Tension-type headache, unspecified, not intractable: Secondary | ICD-10-CM | POA: Diagnosis not present

## 2022-11-14 DIAGNOSIS — M5030 Other cervical disc degeneration, unspecified cervical region: Secondary | ICD-10-CM | POA: Diagnosis not present

## 2022-11-14 NOTE — Patient Instructions (Addendum)
Good to see you  Neck HEP  3 week follow up

## 2022-12-05 NOTE — Progress Notes (Unsigned)
    Penny Acosta D.[place] Phone: [phone]   Assessment and Plan:     There are no diagnoses linked to this encounter.  ***   Pertinent previous records reviewed include ***   Follow Up: ***     Subjective:   I, Penny Acosta, am serving as a Education administrator for Penny Acosta   Chief Complaint: neck pain    HPI:    09/16/2022 Patient is a 52 year old female complaining of neck pain. Patient states that she has had neck pain for years , has done massage, yoga, acupressure, chronic headaches all the time to the point she gets nauseas , just wants help, felt better when she was on meloxicam some times numbness tingling down her right side , decreased ROM, radiating pain her neck and her spine    10/24/2022 Patient states neck isnt doing great , PT today    11/14/2022 Patient states she is a little bit better, dry needling has been helping but it after a couple of days it tightens up    12/08/2022 Patient states    Relevant Historical Information: Recently recovered from concussion, history of breast cancer  Additional pertinent review of systems negative.   Current Outpatient Medications:    amantadine (SYMMETREL) 100 MG capsule, Take 1 capsule (100 mg total) by mouth 2 (two) times daily., Disp: 120 capsule, Rfl: 0   cyclobenzaprine (FLEXERIL) 10 MG tablet, Take 10 mg by mouth 3 (three) times daily as needed for muscle spasms., Disp: , Rfl:    Dulaglutide 0.75 MG/0.5ML SOPN, Inject into the skin., Disp: , Rfl:    ibuprofen (ADVIL) 200 MG tablet, Take 400 mg by mouth every 6 (six) hours as needed., Disp: , Rfl:    meloxicam (MOBIC) 15 MG tablet, Take 1 tablet (15 mg total) by mouth daily., Disp: 30 tablet, Rfl: 0   tamoxifen (NOLVADEX) 20 MG tablet, Take 1 tablet (20 mg total) by mouth daily., Disp: 90 tablet, Rfl: 2   traZODone (DESYREL) 50 MG tablet, TAKE 1 TABLET BY MOUTH NIGHTLY AS NEEDED FOR  SLEEP, Disp: , Rfl:    Objective:     There were no vitals filed for this visit.    There is no height or weight on file to calculate BMI.    Physical Exam:    ***   Electronically signed by:  Penny Acosta D.[name] 1:41 PM 12/05/22

## 2022-12-08 ENCOUNTER — Ambulatory Visit: Payer: BC Managed Care – PPO | Admitting: Sports Medicine

## 2022-12-08 VITALS — BP 120/80 | HR 90 | Ht 62.0 in | Wt 155.0 lb

## 2022-12-08 DIAGNOSIS — G44209 Tension-type headache, unspecified, not intractable: Secondary | ICD-10-CM

## 2022-12-08 DIAGNOSIS — M5030 Other cervical disc degeneration, unspecified cervical region: Secondary | ICD-10-CM | POA: Diagnosis not present

## 2022-12-08 DIAGNOSIS — G8929 Other chronic pain: Secondary | ICD-10-CM | POA: Diagnosis not present

## 2022-12-08 DIAGNOSIS — M546 Pain in thoracic spine: Secondary | ICD-10-CM

## 2022-12-08 DIAGNOSIS — M542 Cervicalgia: Secondary | ICD-10-CM

## 2022-12-08 NOTE — Patient Instructions (Addendum)
Good to see you   

## 2022-12-16 ENCOUNTER — Ambulatory Visit
Admission: RE | Admit: 2022-12-16 | Discharge: 2022-12-16 | Disposition: A | Discharge status: Home / Self Care | Payer: BC Managed Care – PPO | Source: Ambulatory Visit | Attending: Oncology | Admitting: Oncology

## 2022-12-16 DIAGNOSIS — Z08 Encounter for follow-up examination after completed treatment for malignant neoplasm: Secondary | ICD-10-CM

## 2022-12-16 DIAGNOSIS — Z853 Personal history of malignant neoplasm of breast: Secondary | ICD-10-CM | POA: Insufficient documentation

## 2022-12-31 ENCOUNTER — Ambulatory Visit: Payer: BC Managed Care – PPO | Admitting: Sports Medicine

## 2022-12-31 ENCOUNTER — Inpatient Hospital Stay: Payer: BC Managed Care – PPO | Attending: Oncology | Admitting: Oncology

## 2022-12-31 ENCOUNTER — Encounter: Payer: Self-pay | Admitting: Oncology

## 2022-12-31 VITALS — BP 110/59 | HR 72 | Temp 97.2°F | Ht 62.0 in | Wt 169.0 lb

## 2022-12-31 DIAGNOSIS — Z79810 Long term (current) use of selective estrogen receptor modulators (SERMs): Secondary | ICD-10-CM

## 2022-12-31 DIAGNOSIS — Z5181 Encounter for therapeutic drug level monitoring: Secondary | ICD-10-CM | POA: Diagnosis not present

## 2022-12-31 DIAGNOSIS — Z170 Estrogen receptor positive status [ER+]: Secondary | ICD-10-CM | POA: Diagnosis not present

## 2022-12-31 DIAGNOSIS — D0502 Lobular carcinoma in situ of left breast: Secondary | ICD-10-CM | POA: Diagnosis present

## 2023-01-01 NOTE — Progress Notes (Signed)
Penny Acosta D.[facility] [address] [place] [address] Phone: [phone]   Assessment and Plan:     1. Neck pain 2. Tension headache 3. DDD (degenerative disc disease), cervical  -Chronic with exacerbation, subsequent sports medicine visit - Continued neck pain primarily radiating to right side along right trapezius with improving but intermittent symptoms radiating into right upper extremity - Based on failure to improve with >6 weeks of conservative therapy, x-ray images, pain frequently >6/10, pain affecting day-to-day activities, I feel is necessary to proceed with cervical spine MRI at this time - Patient has received benefit from trigger point injections along neck and trapezius, so injections repeated today and tolerated well per note below - Continue HEP and physical therapy.  If patient continues to have difficulty scheduling physical therapy appointments, she can contact our office, and we could refer to a different PT location  Trigger Point Injection: After informed consent was obtained, skin cleaned with alcohol  prep.  A total of 4 trigger points identified along right cervical paraspinal, right trapezius, right scalene.  Injections given over area of pain for total injection of 4 ml lidocaine 1% w/o epi.  Patient had relief after the injection without side effects.  Pt given signs of infection to watch for.    4.  Claustrophobia - Ativan prescribed for as needed use for claustrophobia during MRI  Pertinent previous records reviewed include none   Follow Up: 3 days after MRI to review results and discuss treatment plan   Subjective:   I, Penny Acosta, am serving as a Neurosurgeon for Doctor Richardean Sale   Chief Complaint: neck pain    HPI:    09/16/2022 Patient is a 52 year old female complaining of neck pain. Patient states that she has had neck pain for years , has done massage, yoga, acupressure, chronic headaches all the time to  the point she gets nauseas , just wants help, felt better when she was on meloxicam some times numbness tingling down her right side , decreased ROM, radiating pain her neck and her spine    10/24/2022 Patient states neck isnt doing great , PT today    11/14/2022 Patient states she is a little bit better, dry needling has been helping but it after a couple of days it tightens up    12/08/2022 Patient states after trigger points she felt great, she was able to tell spots that we didn't hit , pain has decreased some    01/02/2023 Patient states same trap and neck tightness , she exercises more   Relevant Historical Information: Recently recovered from concussion, history of breast cancer  Additional pertinent review of systems negative.  Current Outpatient Medications  Medication Sig Dispense Refill   amantadine (SYMMETREL) 100 MG capsule Take 1 capsule (100 mg total) by mouth 2 (two) times daily. 120 capsule 0   cyclobenzaprine (FLEXERIL) 10 MG tablet Take 10 mg by mouth 3 (three) times daily as needed for muscle spasms.     Dulaglutide 0.75 MG/0.5ML SOPN Inject into the skin.     ibuprofen (ADVIL) 200 MG tablet Take 400 mg by mouth every 6 (six) hours as needed.     LORazepam (ATIVAN) 0.5 MG tablet 1-2 tabs 30 - 60 min prior to MRI. Do not drive with this medicine. 4 tablet 0   meloxicam (MOBIC) 15 MG tablet Take 1 tablet (15 mg total) by mouth daily. 30 tablet 0   SOOLANTRA 1 % CREA Apply  topically.     tamoxifen (NOLVADEX) 20 MG tablet Take 1 tablet (20 mg total) by mouth daily. 90 tablet 2   traZODone (DESYREL) 50 MG tablet TAKE 1 TABLET BY MOUTH NIGHTLY AS NEEDED FOR SLEEP     No current facility-administered medications for this visit.      Objective:     Vitals:   01/02/23 0747  BP: 110/68  Pulse: 85  SpO2: 95%  Weight: 169 lb (76.7 kg)  Height:  (1.575 m)      Body mass index is 30.91 kg/m.    Physical Exam:     General: Well-appearing, cooperative, sitting  comfortably in no acute distress.      Neck ROM: Full active ROM, with palpable, currently nonpainful, and audible grinding and clicking most consistent with tight cervical paraspinal musculature  TTP: Bilateral cervical paraspinal worse on right, bilateral trapezius worse on right, right scalene NTTP: cervical spinous processes,    Electronically signed by:  Penny Acosta D.[facility] 8:15 AM 01/02/23

## 2023-01-02 ENCOUNTER — Ambulatory Visit: Payer: BC Managed Care – PPO | Admitting: Sports Medicine

## 2023-01-02 VITALS — BP 110/68 | HR 85 | Ht 62.0 in | Wt 169.0 lb

## 2023-01-02 DIAGNOSIS — F40240 Claustrophobia: Secondary | ICD-10-CM | POA: Diagnosis not present

## 2023-01-02 DIAGNOSIS — M5030 Other cervical disc degeneration, unspecified cervical region: Secondary | ICD-10-CM | POA: Diagnosis not present

## 2023-01-02 DIAGNOSIS — G44209 Tension-type headache, unspecified, not intractable: Secondary | ICD-10-CM | POA: Diagnosis not present

## 2023-01-02 DIAGNOSIS — M542 Cervicalgia: Secondary | ICD-10-CM

## 2023-01-02 MED ORDER — LORAZEPAM 0.5 MG PO TABS: ORAL_TABLET | ORAL | 0 refills | Status: DC

## 2023-01-02 NOTE — Patient Instructions (Addendum)
Good to see you MRI referral  Call us if you would like to change PT locations  Follow up 3 days after to discuss results

## 2023-01-04 NOTE — Progress Notes (Signed)
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  biopsy specimen showed mixed features of dcis and lcis.  She had b/l breast MRI which showed lobular mass in the right breast and possible complex cyst in the left breast. Left breast cyst was a benign sebaceous cyst. Right breast biopsy showed  FIBROCYSTIC CHANGE WITH APOCRINE METAPLASIA, PSEUDOANGIOMATOUS STROMAL HYPERPLASIA.  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  ER greater than 90% positive   Patient is premenopausal and was started on tamoxifen for chemoprevention in May 2021    Interval history- she continues to get monthly menstrual cycles. Tolerating tamoxifen well without any significant side effects.   ECOG PS- 0 Pain scale- 0   Review of systems- Review of Systems  Constitutional:  Negative for chills, fever, malaise/fatigue and weight loss.  HENT:  Negative for congestion, ear discharge and nosebleeds.   Eyes:  Negative for blurred vision.  Respiratory:  Negative for cough, hemoptysis, sputum production, shortness of breath and wheezing.    Cardiovascular:  Negative for chest pain, palpitations, orthopnea and claudication.  Gastrointestinal:  Negative for abdominal pain, blood in stool, constipation, diarrhea, heartburn, melena, nausea and vomiting.  Genitourinary:  Negative for dysuria, flank pain, frequency, hematuria and urgency.  Musculoskeletal:  Negative for back pain, joint pain and myalgias.  Skin:  Negative for rash.  Neurological:  Negative for dizziness, tingling, focal weakness, seizures, weakness and headaches.  Endo/Heme/Allergies:  Does not bruise/bleed easily.  Psychiatric/Behavioral:  Negative for depression and suicidal ideas. The patient does not have insomnia.       No Known Allergies   Past Medical History:  Diagnosis Date   Anxiety    Breast cancer    Cancer    Complication of anesthesia    GERD (gastroesophageal reflux disease)    occ   IUD 02/2016   MIRENA INSERTED 11/2005, 10/2010. 02/2016   PONV (postoperative nausea and vomiting)      Past Surgical History:  Procedure Laterality Date   BREAST BIOPSY Left 01/03/2020   calcs UOQ, ribbon marker, positive   BREAST LUMPECTOMY     CESAREAN SECTION  2007   TWINS   INTRAUTERINE DEVICE INSERTION  02/2016   mirena   PART MASTECTOMY,RADIO FREQUENCY LOCALIZER,AXILLARY SENTINEL NODE BIOPSY Left 01/20/2020   Procedure: PARTIAL MASTECTOMY WITH Radio Frequency AND AXILLARY SENTINEL LYMPH NODE BX;  Surgeon: Carolan Shiver, MD;  Location: [facility] ORS;  Service: General;  Laterality: Left;   REDUCTION MAMMAPLASTY      Social History   Socioeconomic History   Marital status: Married    Spouse name: Not on file   Number of children: Not on file   Years of education: Not on file   Highest  education level: Not on file  Occupational History   Not on file  Tobacco Use   Smoking status: Never   Smokeless tobacco: Never  Vaping Use   Vaping Use: Never used  Substance and Sexual Activity   Alcohol use: Yes   Drug use: No   Sexual activity:  Yes    Partners: Male    Birth control/protection: Condom    Comment: 1st intercourse 52 yo-More than 5 partners  Other Topics Concern   Not on file  Social History Narrative   Not on file   Social Determinants of Health   Financial Resource Strain: Not on file  Food Insecurity: Not on file  Transportation Needs: Not on file  Physical Activity: Not on file  Stress: Not on file  Social Connections: Not on file  Intimate Partner Violence: Not on file    Family History  Problem Relation Age of Onset   Diabetes Father    Parkinsonism Father    Heart failure Father    Prostate cancer Father        dx 82s   Colon polyps Father    Turner syndrome Sister    Cancer Mother 43       pancreatic capsulated, found when took gallbaldder out   Colon polyps Mother    Breast cancer Neg Hx      Current Outpatient Medications:    amantadine (SYMMETREL) 100 MG capsule, Take 1 capsule (100 mg total) by mouth 2 (two) times daily., Disp: 120 capsule, Rfl: 0   cyclobenzaprine (FLEXERIL) 10 MG tablet, Take 10 mg by mouth 3 (three) times daily as needed for muscle spasms., Disp: , Rfl:    Dulaglutide 0.75 MG/0.5ML SOPN, Inject into the skin., Disp: , Rfl:    ibuprofen (ADVIL) 200 MG tablet, Take 400 mg by mouth every 6 (six) hours as needed., Disp: , Rfl:    meloxicam (MOBIC) 15 MG tablet, Take 1 tablet (15 mg total) by mouth daily., Disp: 30 tablet, Rfl: 0   SOOLANTRA 1 % CREA, Apply topically., Disp: , Rfl:    tamoxifen (NOLVADEX) 20 MG tablet, Take 1 tablet (20 mg total) by mouth daily., Disp: 90 tablet, Rfl: 2   traZODone (DESYREL) 50 MG tablet, TAKE 1 TABLET BY MOUTH NIGHTLY AS NEEDED FOR SLEEP, Disp: , Rfl:    LORazepam (ATIVAN) 0.5 MG tablet, 1-2 tabs 30 - 60 min prior to MRI. Do not drive with this medicine., Disp: 4 tablet, Rfl: 0  Physical exam:  Vitals:   12/31/22 1124  BP: (!) 110/59  Pulse: 72  Temp: (!) 97.2 F (36.2 C)  TempSrc: Tympanic  SpO2: 100%  Weight: 169 lb (76.7  kg)  Height:  (1.575 m)   Physical Exam HENT:     Head: Normocephalic and atraumatic.  Eyes:     Pupils: Pupils are equal, round, and reactive to light.  Cardiovascular:     Rate and Rhythm: Normal rate and regular rhythm.     Heart sounds: Normal heart sounds.  Pulmonary:     Effort: Pulmonary effort is normal.     Breath sounds: Normal breath sounds.  Abdominal:     General: Bowel sounds are normal.     Palpations: Abdomen is soft.  Skin:    General: Skin is warm and dry.  Neurological:     Mental Status: She is alert and oriented to person, place, and time.     Breast exam was performed in seated and lying down position.  Patient is status post left lumpectomy with a well-healed surgical scar. No evidence of any palpable masses. No evidence of axillary adenopathy. No evidence of any palpable masses or lumps in the right breast. No evidence of right axillary adenopathy     Latest Ref Rng & Units 05/30/2022    8:01 AM  CMP  Glucose 70 - 99 mg/dL 161   BUN 6 - 20 mg/dL 14   Creatinine 0.96 - 1.00 mg/dL 0.45   Sodium 409 - 811 mmol/L 140   Potassium 3.5 - 5.1 mmol/L 4.0   Chloride 98 - 111 mmol/L 113   CO2 22 - 32 mmol/L 20   Calcium 8.9 - 10.3 mg/dL 8.6       Latest Ref Rng & Units 05/30/2022    8:01 AM  CBC  WBC 4.0 - 10.5 K/uL 5.6   Hemoglobin 12.0 - 15.0 g/dL 91.4   Hematocrit 78.2 - 46.0 % 36.8   Platelets 150 - 400 K/uL 202     No images are attached to the encounter.  MM DIAG BREAST TOMO BILATERAL  Result Date: 12/16/2022 CLINICAL DATA:  LEFT lumpectomy May 2021. No radiation. Patient is taking tamoxifen. Patient had a lumpectomy which demonstrated LCIS but no invasive disease. Original biopsy in April 2021 demonstrated in-situ carcinoma with a focus worrisome for micro invasive disease. MRI guided biopsy in June 2021 was negative. Patient has a remote history of breast reduction. EXAM: DIGITAL DIAGNOSTIC BILATERAL MAMMOGRAM WITH TOMOSYNTHESIS TECHNIQUE:  Bilateral digital diagnostic mammography and breast tomosynthesis was performed. COMPARISON:  Previous exam(s). ACR Breast Density Category b: There are scattered areas of fibroglandular density. FINDINGS: There is density and architectural distortion within the LEFT breast, consistent with postsurgical changes. These are stable in comparison to prior. Stable post reduction changes of the RIGHT breast. No suspicious mass, distortion, or microcalcifications are identified to suggest presence of malignancy. IMPRESSION: No mammographic evidence of malignancy bilaterally. RECOMMENDATION: Per protocol, as the patient is now 2 or more years status post lumpectomy, she may return to annual screening mammography in 1 year. However, given the history of breast cancer, the patient remains eligible for annual diagnostic mammography if preferred. (Code:SM-B-01Y) Given clinical history, recommend supplemental screening with annual breast MRI with and without contrast/abbreviated breast MRI. The American Cancer Society recommends annual MRI and mammography in patients with an estimated lifetime risk of developing breast cancer greater than 20 - 25%, or who are known or suspected to be positive for the breast cancer gene. I have discussed the findings and recommendations with the patient. If applicable, a reminder letter will be sent to the patient regarding the next appointment. BI-RADS CATEGORY  2: Benign. Electronically Signed   By: Meda Klinefelter M.D.   On: 12/16/2022 15:30     Assessment and plan- Patient is a 52 y.o. 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 Tolerating it well without significant side effects.   Given high risk of breast cancer, we are alternating mri and mammogram yearly. She will get her breast MRI in November 2024. I will see her back in 6 months no labs. Her bone density scan from oct 2023 was  normal.     Visit Diagnosis 1. Breast neoplasm, Tis (LCIS), left   2. Encounter for monitoring tamoxifen therapy      Dr. Owens Shark, MD, MPH [facility] at [facility] [phone] 01/04/2023 3:04 PM

## 2023-01-31 ENCOUNTER — Ambulatory Visit
Admission: RE | Admit: 2023-01-31 | Discharge: 2023-01-31 | Disposition: A | Discharge status: Home / Self Care | Payer: BC Managed Care – PPO | Source: Ambulatory Visit | Attending: Sports Medicine | Admitting: Sports Medicine

## 2023-01-31 DIAGNOSIS — G44209 Tension-type headache, unspecified, not intractable: Secondary | ICD-10-CM

## 2023-01-31 DIAGNOSIS — M5030 Other cervical disc degeneration, unspecified cervical region: Secondary | ICD-10-CM

## 2023-01-31 DIAGNOSIS — M542 Cervicalgia: Secondary | ICD-10-CM

## 2023-02-03 NOTE — Progress Notes (Unsigned)
    Penny Acosta D.[facility] [address] [place] [address] Phone: [phone]   Assessment and Plan:     There are no diagnoses linked to this encounter.  ***   Pertinent previous records reviewed include ***   Follow Up: ***     Subjective:   I, Penny Acosta, am serving as a Neurosurgeon for Doctor Richardean Sale   Chief Complaint: neck pain    HPI:    09/16/2022 Patient is a 52 year old female complaining of neck pain. Patient states that she has had neck pain for years , has done massage, yoga, acupressure, chronic headaches all the time to the point she gets nauseas , just wants help, felt better when she was on meloxicam some times numbness tingling down her right side , decreased ROM, radiating pain her neck and her spine    10/24/2022 Patient states neck isnt doing great , PT today    11/14/2022 Patient states she is a little bit better, dry needling has been helping but it after a couple of days it tightens up    12/08/2022 Patient states after trigger points she felt great, she was able to tell spots that we didn't hit , pain has decreased some    01/02/2023 Patient states same trap and neck tightness , she exercises more   02/04/2023 Patient states    Relevant Historical Information: Recently recovered from concussion, history of breast cancer Additional pertinent review of systems negative.   Current Outpatient Medications:    amantadine (SYMMETREL) 100 MG capsule, Take 1 capsule (100 mg total) by mouth 2 (two) times daily., Disp: 120 capsule, Rfl: 0   cyclobenzaprine (FLEXERIL) 10 MG tablet, Take 10 mg by mouth 3 (three) times daily as needed for muscle spasms., Disp: , Rfl:    Dulaglutide 0.75 MG/0.5ML SOPN, Inject into the skin., Disp: , Rfl:    ibuprofen (ADVIL) 200 MG tablet, Take 400 mg by mouth every 6 (six) hours as needed., Disp: , Rfl:    LORazepam (ATIVAN) 0.5 MG tablet, 1-2 tabs 30 - 60 min prior to MRI. Do  not drive with this medicine., Disp: 4 tablet, Rfl: 0   meloxicam (MOBIC) 15 MG tablet, Take 1 tablet (15 mg total) by mouth daily., Disp: 30 tablet, Rfl: 0   SOOLANTRA 1 % CREA, Apply topically., Disp: , Rfl:    tamoxifen (NOLVADEX) 20 MG tablet, Take 1 tablet (20 mg total) by mouth daily., Disp: 90 tablet, Rfl: 2   traZODone (DESYREL) 50 MG tablet, TAKE 1 TABLET BY MOUTH NIGHTLY AS NEEDED FOR SLEEP, Disp: , Rfl:    Objective:     There were no vitals filed for this visit.    There is no height or weight on file to calculate BMI.    Physical Exam:    ***   Electronically signed by:  Penny Acosta [facility] 3:28 PM 02/03/23

## 2023-02-04 ENCOUNTER — Ambulatory Visit: Payer: BC Managed Care – PPO | Admitting: Sports Medicine

## 2023-02-04 VITALS — BP 108/78 | HR 76 | Ht 62.0 in | Wt 169.0 lb

## 2023-02-04 DIAGNOSIS — M47812 Spondylosis without myelopathy or radiculopathy, cervical region: Secondary | ICD-10-CM | POA: Diagnosis not present

## 2023-02-04 DIAGNOSIS — M542 Cervicalgia: Secondary | ICD-10-CM | POA: Diagnosis not present

## 2023-02-04 DIAGNOSIS — M5030 Other cervical disc degeneration, unspecified cervical region: Secondary | ICD-10-CM

## 2023-02-04 NOTE — Patient Instructions (Addendum)
Good to see you  Facet injection C4-5  Follow up 2 weeks after to discuss injections

## 2023-05-06 ENCOUNTER — Other Ambulatory Visit: Payer: BC Managed Care – PPO

## 2023-05-06 ENCOUNTER — Inpatient Hospital Stay: Admission: RE | Admit: 2023-05-06 | Payer: BC Managed Care – PPO | Source: Ambulatory Visit

## 2023-05-13 NOTE — Discharge Instructions (Signed)

## 2023-05-14 ENCOUNTER — Ambulatory Visit
Admission: RE | Admit: 2023-05-14 | Discharge: 2023-05-14 | Disposition: A | Discharge status: Home / Self Care | Payer: BC Managed Care – PPO | Source: Ambulatory Visit | Attending: Sports Medicine | Admitting: Sports Medicine

## 2023-05-14 DIAGNOSIS — M5030 Other cervical disc degeneration, unspecified cervical region: Secondary | ICD-10-CM

## 2023-05-14 DIAGNOSIS — M542 Cervicalgia: Secondary | ICD-10-CM

## 2023-05-14 MED ORDER — IOPAMIDOL (ISOVUE-M 300) INJECTION 61%
1.0000 mL | Freq: Once | INTRAMUSCULAR | Status: AC | PRN
  Administered 2023-05-14: 1 mL via INTRA_ARTICULAR

## 2023-05-14 MED ORDER — DEXAMETHASONE SODIUM PHOSPHATE 4 MG/ML IJ SOLN
10.0000 mg | Freq: Once | INTRAMUSCULAR | Status: AC
  Administered 2023-05-14: 10 mg via INTRA_ARTICULAR

## 2023-05-29 NOTE — Progress Notes (Unsigned)
    Aleen Sells D.[facility] [address] [place] [address] Phone: [phone]   Assessment and Plan:     There are no diagnoses linked to this encounter.  ***   Pertinent previous records reviewed include ***   Follow Up: ***     Subjective:   I, Penny Acosta, am serving as a Neurosurgeon for Doctor Richardean Sale   Chief Complaint: neck pain    HPI:    09/16/2022 Patient is a 52 year old female complaining of neck pain. Patient states that she has had neck pain for years , has done massage, yoga, acupressure, chronic headaches all the time to the point she gets nauseas , just wants help, felt better when she was on meloxicam some times numbness tingling down her right side , decreased ROM, radiating pain her neck and her spine    10/24/2022 Patient states neck isnt doing great , PT today    11/14/2022 Patient states she is a little bit better, dry needling has been helping but it after a couple of days it tightens up    12/08/2022 Patient states after trigger points she felt great, she was able to tell spots that we didn't hit , pain has decreased some    01/02/2023 Patient states same trap and neck tightness , she exercises more    02/04/2023 Patient states that she is the same neck is still sore    06/01/2023 Patient states   Relevant Historical Information: Recently recovered from concussion, history of breast cancer  Additional pertinent review of systems negative.   Current Outpatient Medications:    amantadine (SYMMETREL) 100 MG capsule, Take 1 capsule (100 mg total) by mouth 2 (two) times daily., Disp: 120 capsule, Rfl: 0   cyclobenzaprine (FLEXERIL) 10 MG tablet, Take 10 mg by mouth 3 (three) times daily as needed for muscle spasms., Disp: , Rfl:    Dulaglutide 0.75 MG/0.5ML SOPN, Inject into the skin., Disp: , Rfl:    ibuprofen (ADVIL) 200 MG tablet, Take 400 mg by mouth every 6 (six) hours as needed., Disp: , Rfl:     LORazepam (ATIVAN) 0.5 MG tablet, 1-2 tabs 30 - 60 min prior to MRI. Do not drive with this medicine., Disp: 4 tablet, Rfl: 0   meloxicam (MOBIC) 15 MG tablet, Take 1 tablet (15 mg total) by mouth daily., Disp: 30 tablet, Rfl: 0   SOOLANTRA 1 % CREA, Apply topically., Disp: , Rfl:    tamoxifen (NOLVADEX) 20 MG tablet, Take 1 tablet (20 mg total) by mouth daily., Disp: 90 tablet, Rfl: 2   traZODone (DESYREL) 50 MG tablet, TAKE 1 TABLET BY MOUTH NIGHTLY AS NEEDED FOR SLEEP, Disp: , Rfl:    Objective:     There were no vitals filed for this visit.    There is no height or weight on file to calculate BMI.    Physical Exam:    ***   Electronically signed by:  Aleen Sells D.[facility] 12:02 PM 05/29/23

## 2023-06-01 ENCOUNTER — Ambulatory Visit: Payer: BC Managed Care – PPO | Admitting: Sports Medicine

## 2023-06-01 VITALS — BP 90/60 | HR 77 | Ht 62.0 in | Wt 169.0 lb

## 2023-06-01 DIAGNOSIS — M542 Cervicalgia: Secondary | ICD-10-CM

## 2023-06-01 DIAGNOSIS — M5030 Other cervical disc degeneration, unspecified cervical region: Secondary | ICD-10-CM

## 2023-06-01 DIAGNOSIS — M47812 Spondylosis without myelopathy or radiculopathy, cervical region: Secondary | ICD-10-CM

## 2023-06-01 DIAGNOSIS — G44209 Tension-type headache, unspecified, not intractable: Secondary | ICD-10-CM | POA: Diagnosis not present

## 2023-06-03 ENCOUNTER — Other Ambulatory Visit: Payer: Self-pay | Admitting: General Surgery

## 2023-06-03 DIAGNOSIS — Z9189 Other specified personal risk factors, not elsewhere classified: Secondary | ICD-10-CM

## 2023-06-03 DIAGNOSIS — Z86000 Personal history of in-situ neoplasm of breast: Secondary | ICD-10-CM

## 2023-06-15 ENCOUNTER — Encounter: Payer: Self-pay | Admitting: Sports Medicine

## 2023-06-16 ENCOUNTER — Encounter: Payer: Self-pay | Admitting: Oncology

## 2023-06-16 ENCOUNTER — Ambulatory Visit: Payer: BC Managed Care – PPO

## 2023-07-03 ENCOUNTER — Inpatient Hospital Stay: Payer: BC Managed Care – PPO | Attending: Oncology | Admitting: Oncology

## 2023-07-03 ENCOUNTER — Encounter: Payer: Self-pay | Admitting: Oncology

## 2023-07-03 VITALS — BP 106/64 | HR 85 | Temp 97.7°F | Resp 18 | Ht 63.0 in | Wt 175.7 lb

## 2023-07-03 DIAGNOSIS — D0502 Lobular carcinoma in situ of left breast: Secondary | ICD-10-CM | POA: Diagnosis not present

## 2023-07-03 DIAGNOSIS — Z5181 Encounter for therapeutic drug level monitoring: Secondary | ICD-10-CM | POA: Diagnosis not present

## 2023-07-03 DIAGNOSIS — Z79810 Long term (current) use of selective estrogen receptor modulators (SERMs): Secondary | ICD-10-CM | POA: Diagnosis not present

## 2023-07-03 NOTE — Progress Notes (Signed)
Hematology/Oncology Consult note [facility]  [phone] [phone]  Patient Care Team: Marisue Ivan, MD as PCP - General (Family Medicine) Creig Hines, MD as Consulting Physician (Oncology) Carolan Shiver, MD as Consulting Physician (General Surgery) Scarlett Presto, RN (Inactive) as Registered Nurse   Name of the patient: Penny Acosta  [id number]  10-Jan-1971   Date of visit: 07/03/23  Diagnosis-history of LCIS  Chief complaint/ Reason for visit-routine follow-up of LCIS on tamoxifen  Heme/Onc history:  Patient is a 52 year old female who  underwent a screening mammogram on 12/08/2019 which showed left breast calcifications.  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  biopsy specimen showed mixed features of dcis and lcis.  She had b/l breast MRI which showed lobular mass in the right breast and possible complex cyst in the left breast. Left breast cyst was a benign sebaceous cyst. Right breast biopsy showed  FIBROCYSTIC CHANGE WITH APOCRINE METAPLASIA, PSEUDOANGIOMATOUS STROMAL HYPERPLASIA.  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  ER greater than 90% positive.  She has been on tamoxifen for chemoprevention since May 2021  Interval history-occasional headaches and neck pain for which she has been following up with sports medicine for tension headache.  Tolerating tamoxifen well without any significant side effects  ECOG PS- 0 Pain scale- 0   Review of systems- Review of Systems  Constitutional:  Negative for chills, fever, malaise/fatigue and weight loss.  HENT:  Negative for congestion, ear discharge and nosebleeds.   Eyes:  Negative for blurred vision.  Respiratory:  Negative for cough, hemoptysis, sputum  production, shortness of breath and wheezing.   Cardiovascular:  Negative for chest pain, palpitations, orthopnea and claudication.  Gastrointestinal:  Negative for abdominal pain, blood in stool, constipation, diarrhea, heartburn, melena, nausea and vomiting.  Genitourinary:  Negative for dysuria, flank pain, frequency, hematuria and urgency.  Musculoskeletal:  Negative for back pain, joint pain and myalgias.  Skin:  Negative for rash.  Neurological:  Negative for dizziness, tingling, focal weakness, seizures, weakness and headaches.  Endo/Heme/Allergies:  Does not bruise/bleed easily.  Psychiatric/Behavioral:  Negative for depression and suicidal ideas. The patient does not have insomnia.       No Known Allergies   Past Medical History:  Diagnosis Date   Anxiety    Breast cancer (HCC)    Cancer (HCC)    Complication of anesthesia    GERD (gastroesophageal reflux disease)    occ   IUD 02/2016   MIRENA INSERTED 11/2005, 10/2010. 02/2016   PONV (postoperative nausea and vomiting)      Past Surgical History:  Procedure Laterality Date   BREAST BIOPSY Left 01/03/2020   calcs UOQ, ribbon marker, positive   BREAST LUMPECTOMY     CESAREAN SECTION  2007   TWINS   INTRAUTERINE DEVICE INSERTION  02/2016   mirena   PART MASTECTOMY,RADIO FREQUENCY LOCALIZER,AXILLARY SENTINEL NODE BIOPSY Left 01/20/2020   Procedure: PARTIAL MASTECTOMY WITH Radio Frequency AND AXILLARY SENTINEL LYMPH NODE BX;  Surgeon: Carolan Shiver, MD;  Location: [facility] ORS;  Service: General;  Laterality: Left;   REDUCTION MAMMAPLASTY      Social History   Socioeconomic History   Marital status: Married    Spouse name: Not on file   Number of children: Not on file   Years of education:  Not on file   Highest education level: Not on file  Occupational History   Not on file  Tobacco Use   Smoking status: Never   Smokeless tobacco: Never  Vaping Use   Vaping status: Never Used  Substance and Sexual  Activity   Alcohol use: Yes   Drug use: No   Sexual activity: Yes    Partners: Male    Birth control/protection: Condom    Comment: 1st intercourse 52 yo-More than 5 partners  Other Topics Concern   Not on file  Social History Narrative   Not on file   Social Determinants of Health   Financial Resource Strain: Low Risk  (05/01/2023)   Received from [facility]   Overall Financial Resource Strain (CARDIA)    Difficulty of Paying Living Expenses: Not hard at all  Food Insecurity: No Food Insecurity (05/01/2023)   Received from [facility]   Hunger Vital Sign    Worried About Running Out of Food in the Last Year: Never true    Ran Out of Food in the Last Year: Never true  Transportation Needs: No Transportation Needs (05/01/2023)   Received from [facility] - Transportation    In the past 12 months, has lack of transportation kept you from medical appointments or from getting medications?: No    Lack of Transportation (Non-Medical): No  Physical Activity: Not on file  Stress: Not on file  Social Connections: Not on file  Intimate Partner Violence: Not on file    Family History  Problem Relation Age of Onset   Diabetes Father    Parkinsonism Father    Heart failure Father    Prostate cancer Father        dx 46s   Colon polyps Father    Turner syndrome Sister    Cancer Mother 40       pancreatic capsulated, found when took gallbaldder out   Colon polyps Mother    Breast cancer Neg Hx      Current Outpatient Medications:    cyanocobalamin (VITAMIN B12) 1000 MCG/ML injection, Inject into the muscle., Disp: , Rfl:    amantadine (SYMMETREL) 100 MG capsule, Take 1 capsule (100 mg total) by mouth 2 (two) times daily., Disp: 120 capsule, Rfl: 0   cyclobenzaprine (FLEXERIL) 10 MG tablet, Take 10 mg by mouth 3 (three) times daily as needed for muscle spasms., Disp: , Rfl:    Dulaglutide 0.75 MG/0.5ML SOPN, Inject  into the skin., Disp: , Rfl:    ibuprofen (ADVIL) 200 MG tablet, Take 400 mg by mouth every 6 (six) hours as needed., Disp: , Rfl:    LORazepam (ATIVAN) 0.5 MG tablet, 1-2 tabs 30 - 60 min prior to MRI. Do not drive with this medicine., Disp: 4 tablet, Rfl: 0   meloxicam (MOBIC) 15 MG tablet, Take 1 tablet (15 mg total) by mouth daily., Disp: 30 tablet, Rfl: 0   SOOLANTRA 1 % CREA, Apply topically., Disp: , Rfl:    tamoxifen (NOLVADEX) 20 MG tablet, Take 1 tablet (20 mg total) by mouth daily., Disp: 90 tablet, Rfl: 2   traZODone (DESYREL) 50 MG tablet, TAKE 1 TABLET BY MOUTH NIGHTLY AS NEEDED FOR SLEEP, Disp: , Rfl:    venlafaxine XR (EFFEXOR-XR) 37.5 MG 24 hr capsule, Take by mouth., Disp: , Rfl:   Physical exam:  Vitals:   07/03/23 1124  BP: 106/64  Pulse: 85  Resp: 18  Temp: 97.7  F (36.5 C)  TempSrc: Tympanic  SpO2: 98%  Weight: 175 lb 11.2 oz (79.7 kg)  Height: 5\' 3"  (1.6 m)   Physical Exam Cardiovascular:     Rate and Rhythm: Normal rate and regular rhythm.     Heart sounds: Normal heart sounds.  Pulmonary:     Effort: Pulmonary effort is normal.  Skin:    General: Skin is warm and dry.  Neurological:     Mental Status: She is alert and oriented to person, place, and time.   Breast exam: Patient is s/p bilateral breast reduction surgery and mastopexy.  She is s/p left lumpectomy with sentinel lymph node biopsy.  no palpable bilateral breast masses.  No palpable bilateral axillary adenopathy.     Latest Ref Rng & Units 05/30/2022    8:01 AM  CMP  Glucose 70 - 99 mg/dL 102   BUN 6 - 20 mg/dL 14   Creatinine 7.25 - 1.00 mg/dL 3.66   Sodium 440 - 347 mmol/L 140   Potassium 3.5 - 5.1 mmol/L 4.0   Chloride 98 - 111 mmol/L 113   CO2 22 - 32 mmol/L 20   Calcium 8.9 - 10.3 mg/dL 8.6       Latest Ref Rng & Units 05/30/2022    8:01 AM  CBC  WBC 4.0 - 10.5 K/uL 5.6   Hemoglobin 12.0 - 15.0 g/dL 42.5   Hematocrit 95.6 - 46.0 % 36.8   Platelets 150 - 400 K/uL 202       Assessment and plan- Patient is a 52 y.o. female with history of left breast LCIS s/p lumpectomy currently on tamoxifen here for routine follow-up  Clinically patient is doing well with no concerning signs and symptoms of recurrence based on today's exam.  She will continue taking tamoxifen for 2 more years.  I will see her back in 6 months no labs.  We had initially planned for mammograms alternating with MRIs given her increased risk of breast cancer following LCIS diagnosis.  However she has a significant co-pay associated with MRI and therefore we are only proceeding with yearly mammograms at this time   Visit Diagnosis 1. Encounter for monitoring tamoxifen therapy   2. Breast neoplasm, Tis (LCIS), left      Dr. Owens Shark, MD, MPH [facility] at [facility] [phone] 07/03/2023 1:22 PM

## 2023-07-05 ENCOUNTER — Encounter: Payer: Self-pay | Admitting: Oncology

## 2023-07-06 ENCOUNTER — Other Ambulatory Visit: Payer: Self-pay | Admitting: *Deleted

## 2023-07-06 MED ORDER — TAMOXIFEN CITRATE 20 MG PO TABS: 20.0000 mg | ORAL_TABLET | Freq: Every day | ORAL | 2 refills | Status: DC

## 2023-07-06 NOTE — Telephone Encounter (Signed)
Pt sent mychart msg requesting RF

## 2023-11-23 ENCOUNTER — Other Ambulatory Visit: Payer: Self-pay | Admitting: General Surgery

## 2023-11-23 DIAGNOSIS — Z1231 Encounter for screening mammogram for malignant neoplasm of breast: Secondary | ICD-10-CM

## 2023-12-18 ENCOUNTER — Ambulatory Visit
Admission: RE | Admit: 2023-12-18 | Discharge: 2023-12-18 | Disposition: A | Discharge status: Home / Self Care | Payer: Self-pay | Source: Ambulatory Visit | Attending: General Surgery | Admitting: General Surgery

## 2023-12-18 DIAGNOSIS — Z1231 Encounter for screening mammogram for malignant neoplasm of breast: Secondary | ICD-10-CM | POA: Insufficient documentation

## 2024-01-01 ENCOUNTER — Inpatient Hospital Stay: Payer: BC Managed Care – PPO | Attending: Oncology | Admitting: Oncology

## 2024-01-01 ENCOUNTER — Encounter: Payer: Self-pay | Admitting: Oncology

## 2024-01-01 VITALS — BP 108/64 | HR 78 | Temp 98.0°F | Resp 18 | Ht 63.0 in | Wt 170.0 lb

## 2024-01-01 DIAGNOSIS — Z79810 Long term (current) use of selective estrogen receptor modulators (SERMs): Secondary | ICD-10-CM | POA: Insufficient documentation

## 2024-01-01 DIAGNOSIS — Z5181 Encounter for therapeutic drug level monitoring: Secondary | ICD-10-CM

## 2024-01-01 DIAGNOSIS — Z79899 Other long term (current) drug therapy: Secondary | ICD-10-CM

## 2024-01-01 DIAGNOSIS — D0502 Lobular carcinoma in situ of left breast: Secondary | ICD-10-CM | POA: Diagnosis present

## 2024-01-01 DIAGNOSIS — Z170 Estrogen receptor positive status [ER+]: Secondary | ICD-10-CM | POA: Diagnosis not present

## 2024-01-01 NOTE — Progress Notes (Signed)
 Hematology/Oncology Consult note [facility]  Telephone:([phone] Fax:([phone]  Patient Care Team: Alla Amis, MD as PCP - General (Family Medicine) Melanee Annah BROCKS, MD as Consulting Physician (Oncology) Rodolph Romano, MD as Consulting Physician (General Surgery) Dannielle Arlean FALCON, RN (Inactive) as Registered Nurse   Name of the patient: Penny Acosta  [id number]  1971-07-09   Date of visit: 01/01/24  Diagnosis-history of LCIS  Chief complaint/ Reason for visit-routine follow-up of LCIS on tamoxifen   Heme/Onc history: Patient is a 53 year old female who  underwent a screening mammogram on 12/08/2019 which showed left breast calcifications.  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  biopsy specimen showed mixed features of dcis and lcis.  She had b/l breast MRI which showed lobular mass in the right breast and possible complex cyst in the left breast. Left breast cyst was a benign sebaceous cyst. Right breast biopsy showed  FIBROCYSTIC CHANGE WITH APOCRINE METAPLASIA, PSEUDOANGIOMATOUS STROMAL HYPERPLASIA.  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  ER greater than 90% positive.  She has been on tamoxifen  for chemoprevention since May 2021  Interval history-had her menstrual cycle a couple of months ago after a break for 4 to 5 months.  She is currently perimenopausal.  Tolerating tamoxifen  well without any significant side effects  ECOG PS- 0 Pain scale- 0   Review of systems- Review of Systems  Constitutional:  Negative for chills, fever, malaise/fatigue and weight loss.  HENT:  Negative for congestion, ear discharge and nosebleeds.   Eyes:  Negative for blurred vision.  Respiratory:  Negative for cough, hemoptysis, sputum  production, shortness of breath and wheezing.   Cardiovascular:  Negative for chest pain, palpitations, orthopnea and claudication.  Gastrointestinal:  Negative for abdominal pain, blood in stool, constipation, diarrhea, heartburn, melena, nausea and vomiting.  Genitourinary:  Negative for dysuria, flank pain, frequency, hematuria and urgency.  Musculoskeletal:  Negative for back pain, joint pain and myalgias.  Skin:  Negative for rash.  Neurological:  Negative for dizziness, tingling, focal weakness, seizures, weakness and headaches.  Endo/Heme/Allergies:  Does not bruise/bleed easily.  Psychiatric/Behavioral:  Negative for depression and suicidal ideas. The patient does not have insomnia.       No Known Allergies   Past Medical History:  Diagnosis Date   Anxiety    Breast cancer (HCC)    Cancer (HCC)    Complication of anesthesia    GERD (gastroesophageal reflux disease)    occ   IUD 02/2016   MIRENA  INSERTED 11/2005, 10/2010. 02/2016   PONV (postoperative nausea and vomiting)      Past Surgical History:  Procedure Laterality Date   BREAST BIOPSY Left 01/03/2020   calcs UOQ, ribbon marker, positive   BREAST LUMPECTOMY     CESAREAN SECTION  2007   TWINS   INTRAUTERINE DEVICE INSERTION  02/2016   mirena    PART MASTECTOMY,RADIO FREQUENCY LOCALIZER,AXILLARY SENTINEL NODE BIOPSY Left 01/20/2020   Procedure: PARTIAL MASTECTOMY WITH Radio Frequency AND AXILLARY SENTINEL LYMPH NODE BX;  Surgeon: Rodolph Romano, MD;  Location: [facility] ORS;  Service: General;  Laterality: Left;   REDUCTION MAMMAPLASTY      Social History   Socioeconomic History   Marital status: Married    Spouse name: Not on file   Number of children: Not on file  Years of education: Not on file   Highest education level: Not on file  Occupational History   Not on file  Tobacco Use   Smoking status: Never   Smokeless tobacco: Never  Vaping Use   Vaping status: Never Used  Substance and Sexual  Activity   Alcohol use: Yes   Drug use: No   Sexual activity: Yes    Partners: Male    Birth control/protection: Condom    Comment: 1st intercourse 53 yo-More than 5 partners  Other Topics Concern   Not on file  Social History Narrative   Not on file   Social Drivers of Health   Financial Resource Strain: Low Risk  (11/19/2023)   Received from [facility]   Overall Financial Resource Strain (CARDIA)    Difficulty of Paying Living Expenses: Not hard at all  Food Insecurity: No Food Insecurity (11/19/2023)   [facility]   Hunger Vital Sign    Worried About Running Out of Food in the Last Year: Never true    Ran Out of Food in the Last Year: Never true  Transportation Needs: No Transportation Needs (11/19/2023)   Received from [facility] - Transportation    In the past 12 months, has lack of transportation kept you from medical appointments or from getting medications?: No    Lack of Transportation (Non-Medical): No  Physical Activity: Not on file  Stress: Not on file  Social Connections: Not on file  Intimate Partner Violence: Not on file    Family History  Problem Relation Age of Onset   Diabetes Father    Parkinsonism Father    Heart failure Father    Prostate cancer Father        dx 57s   Colon polyps Father    Turner syndrome Sister    Cancer Mother 110       pancreatic capsulated, found when took gallbaldder out   Colon polyps Mother    Breast cancer Neg Hx      Current Outpatient Medications:    phentermine (ADIPEX-P) 37.5 MG tablet, Take 37.5 mg by mouth., Disp: , Rfl:    amantadine  (SYMMETREL ) 100 MG capsule, Take 1 capsule (100 mg total) by mouth 2 (two) times daily., Disp: 120 capsule, Rfl: 0   busPIRone (BUSPAR) 5 MG tablet, Take by mouth., Disp: , Rfl:    cyanocobalamin (VITAMIN B12) 1000 MCG/ML injection, Inject into the muscle., Disp: , Rfl:    cyclobenzaprine  (FLEXERIL ) 10 MG  tablet, Take 10 mg by mouth 3 (three) times daily as needed for muscle spasms., Disp: , Rfl:    Dulaglutide 0.75 MG/0.5ML SOPN, Inject into the skin., Disp: , Rfl:    ibuprofen (ADVIL) 200 MG tablet, Take 400 mg by mouth every 6 (six) hours as needed., Disp: , Rfl:    LORazepam  (ATIVAN ) 0.5 MG tablet, 1-2 tabs 30 - 60 min prior to MRI. Do not drive with this medicine., Disp: 4 tablet, Rfl: 0   meloxicam  (MOBIC ) 15 MG tablet, Take 1 tablet (15 mg total) by mouth daily., Disp: 30 tablet, Rfl: 0   SOOLANTRA 1 % CREA, Apply topically., Disp: , Rfl:    tamoxifen  (NOLVADEX ) 20 MG tablet, Take 1 tablet (20 mg total) by mouth daily., Disp: 90 tablet, Rfl: 2   traZODone (DESYREL) 50 MG tablet, TAKE 1 TABLET BY MOUTH NIGHTLY AS NEEDED FOR SLEEP, Disp: , Rfl:    Turmeric 500 MG  TABS, 3-500mg  Orally once a day, Disp: , Rfl:    venlafaxine XR (EFFEXOR-XR) 37.5 MG 24 hr capsule, Take by mouth., Disp: , Rfl:   Physical exam: There were no vitals filed for this visit. Physical Exam Cardiovascular:     Rate and Rhythm: Normal rate and regular rhythm.     Heart sounds: Normal heart sounds.  Pulmonary:     Effort: Pulmonary effort is normal.     Breath sounds: Normal breath sounds.  Abdominal:     General: Bowel sounds are normal.     Palpations: Abdomen is soft.  Skin:    General: Skin is warm and dry.  Neurological:     Mental Status: She is alert and oriented to person, place, and time.    Breast exam was performed in seated and lying down position. Patient is status post left lumpectomy with a well-healed surgical scar. No evidence of any palpable masses. No evidence of axillary adenopathy. No evidence of any palpable masses or lumps in the right breast. No evidence of right axillary adenopathy   I have personally reviewed labs listed below:    Latest Ref Rng & Units 05/30/2022    8:01 AM  CMP  Glucose 70 - 99 mg/dL 880   BUN 6 - 20 mg/dL 14   Creatinine 9.55 - 1.00 mg/dL 9.06   Sodium 864 -  854 mmol/L 140   Potassium 3.5 - 5.1 mmol/L 4.0   Chloride 98 - 111 mmol/L 113   CO2 22 - 32 mmol/L 20   Calcium 8.9 - 10.3 mg/dL 8.6       Latest Ref Rng & Units 05/30/2022    8:01 AM  CBC  WBC 4.0 - 10.5 K/uL 5.6   Hemoglobin 12.0 - 15.0 g/dL 88.0   Hematocrit 63.9 - 46.0 % 36.8   Platelets 150 - 400 K/uL 202    I have personally reviewed Radiology images listed below: No images are attached to the encounter.  MM 3D SCREENING MAMMOGRAM BILATERAL BREAST Result Date: 12/22/2023 CLINICAL DATA:  Screening. EXAM: DIGITAL SCREENING BILATERAL MAMMOGRAM WITH TOMOSYNTHESIS AND CAD TECHNIQUE: Bilateral screening digital craniocaudal and mediolateral oblique mammograms were obtained. Bilateral screening digital breast tomosynthesis was performed. The images were evaluated with computer-aided detection. COMPARISON:  Previous exam(s). ACR Breast Density Category b: There are scattered areas of fibroglandular density. FINDINGS: There are no findings suspicious for malignancy. IMPRESSION: No mammographic evidence of malignancy. A result letter of this screening mammogram will be mailed directly to the patient. RECOMMENDATION: Screening mammogram in one year. (Code:SM-B-01Y) BI-RADS CATEGORY  1: Negative. Electronically Signed   By: Debby Satterfield M.D.   On: 12/22/2023 15:59     Assessment and plan- Patient is a 53 y.o. female with history of LCIS currently on tamoxifen  here for a routine follow-up  Patient was tolerating Tamoxifen  well without any significant side effects and will continue taking it for 1 more year ending in May 2026.  I will see her back in 6 months no labs.  Her mammogram from April 2025 was unremarkable.  Clinically she is doing well with no concerning findings on today's exam   Visit Diagnosis 1. Breast neoplasm, Tis (LCIS), left   2. Encounter for monitoring tamoxifen  therapy   3. High risk medication use      Dr. Annah Skene, MD, MPH [facility] [phone] 01/01/2024 1:12 PM

## 2024-05-18 ENCOUNTER — Other Ambulatory Visit: Payer: Self-pay | Admitting: Oncology

## 2024-07-01 ENCOUNTER — Inpatient Hospital Stay: Attending: Oncology | Admitting: Oncology

## 2024-07-01 ENCOUNTER — Encounter: Payer: Self-pay | Admitting: Oncology

## 2024-07-01 VITALS — BP 109/71 | HR 75 | Temp 96.3°F | Resp 16 | Wt 146.0 lb

## 2024-07-01 DIAGNOSIS — Z79810 Long term (current) use of selective estrogen receptor modulators (SERMs): Secondary | ICD-10-CM | POA: Insufficient documentation

## 2024-07-01 DIAGNOSIS — Z79899 Other long term (current) drug therapy: Secondary | ICD-10-CM

## 2024-07-01 DIAGNOSIS — Z5181 Encounter for therapeutic drug level monitoring: Secondary | ICD-10-CM | POA: Diagnosis not present

## 2024-07-01 DIAGNOSIS — D0502 Lobular carcinoma in situ of left breast: Secondary | ICD-10-CM | POA: Diagnosis not present

## 2024-07-01 NOTE — Progress Notes (Signed)
 Hematology/Oncology Consult note [facility]  Telephone:([phone] [phone]  Patient Care Team: Alla Amis, MD as PCP - General (Family Medicine) Melanee Annah BROCKS, MD as Consulting Physician (Oncology) Rodolph Romano, MD as Consulting Physician (General Surgery) Dannielle Arlean FALCON, RN (Inactive) as Registered Nurse   Name of the patient: Penny Acosta  [id number]  [birth year]   Date of visit: 07/01/24  Diagnosis-history of LCIS  Chief complaint/ Reason for visit-routine follow-up of LCIS on tamoxifen   Heme/Onc history: Patient is a 53 year old female who  underwent a screening mammogram on 12/08/2019 which showed left breast calcifications.  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  biopsy specimen showed mixed features of dcis and lcis.  She had b/l breast MRI which showed lobular mass in the right breast and possible complex cyst in the left breast. Left breast cyst was a benign sebaceous cyst. Right breast biopsy showed  FIBROCYSTIC CHANGE WITH APOCRINE METAPLASIA, PSEUDOANGIOMATOUS STROMAL HYPERPLASIA.  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  ER greater than 90% positive.  She has been on tamoxifen  for chemoprevention since May 2021    Interval history- Penny Acosta is a 53 year old female with LCIS who presents for follow-up regarding tamoxifen  therapy.  She has been on tamoxifen  for the management of LCIS since it was discovered while she was premenopausal. She is scheduled to complete five years of tamoxifen  therapy in May 2026.  She inquired about the effects of tamoxifen  on her menstrual cycle, noting that it has had a variable effect. Her menstrual cycles have mostly stopped, with only one occurrence since April.  She  had a mammogram in April. She is considering follow-up options for future mammograms and breast exams.  ECOG PS- 0 Pain scale- 0   Review of systems- Review of Systems  Constitutional:  Negative for chills, fever, malaise/fatigue and weight loss.  HENT:  Negative for congestion, ear discharge and nosebleeds.   Eyes:  Negative for blurred vision.  Respiratory:  Negative for cough, hemoptysis, sputum production, shortness of breath and wheezing.   Cardiovascular:  Negative for chest pain, palpitations, orthopnea and claudication.  Gastrointestinal:  Negative for abdominal pain, blood in stool, constipation, diarrhea, heartburn, melena, nausea and vomiting.  Genitourinary:  Negative for dysuria, flank pain, frequency, hematuria and urgency.  Musculoskeletal:  Negative for back pain, joint pain and myalgias.  Skin:  Negative for rash.  Neurological:  Negative for dizziness, tingling, focal weakness, seizures, weakness and headaches.  Endo/Heme/Allergies:  Does not bruise/bleed easily.  Psychiatric/Behavioral:  Negative for depression and suicidal ideas. The patient does not have insomnia.       No Known Allergies   Past Medical History:  Diagnosis Date   Anxiety    Breast cancer (HCC)    Cancer (HCC)    Complication of anesthesia    GERD (gastroesophageal reflux disease)    occ   IUD 02/2016   MIRENA  INSERTED 11/2005, 10/2010. 02/2016   PONV (postoperative nausea and vomiting)      Past Surgical History:  Procedure Laterality Date   BREAST BIOPSY Left 01/03/2020   calcs UOQ, ribbon marker, positive   BREAST LUMPECTOMY     CESAREAN SECTION  2007   TWINS   INTRAUTERINE DEVICE INSERTION  02/2016   mirena    PART MASTECTOMY,RADIO FREQUENCY LOCALIZER,AXILLARY  SENTINEL NODE BIOPSY Left 01/20/2020   Procedure: PARTIAL MASTECTOMY WITH Radio Frequency AND AXILLARY SENTINEL LYMPH NODE BX;  Surgeon: Rodolph Romano, MD;  Location: [facility] ORS;  Service: General;  Laterality: Left;    REDUCTION MAMMAPLASTY      Social History   Socioeconomic History   Marital status: Married    Spouse name: Not on file   Number of children: Not on file   Years of education: Not on file   Highest education level: Not on file  Occupational History   Not on file  Tobacco Use   Smoking status: Never   Smokeless tobacco: Never  Vaping Use   Vaping status: Never Used  Substance and Sexual Activity   Alcohol use: Yes   Drug use: No   Sexual activity: Yes    Partners: Male    Birth control/protection: Condom    Comment: 1st intercourse 53 yo-More than 5 partners  Other Topics Concern   Not on file  Social History Narrative   Not on file   Social Drivers of Health   Financial Resource Strain: Low Risk  (11/19/2023)   Received from [facility]   Overall Financial Resource Strain (CARDIA)    Difficulty of Paying Living Expenses: Not hard at all  Food Insecurity: No Food Insecurity (11/19/2023)   Received [facility]   Hunger Vital Sign    Within the past 12 months, you worried that your food would run out before you got the money to buy more.: Never true    Within the past 12 months, the food you bought just didn't last and you didn't have money to get more.: Never true  Transportation Needs: No Transportation Needs (11/19/2023)   Received from [facility] - Transportation    In the past 12 months, has lack of transportation kept you from medical appointments or from getting medications?: No    Lack of Transportation (Non-Medical): No  Physical Activity: Not on file  Stress: Not on file  Social Connections: Not on file  Intimate Partner Violence: Not on file    Family History  Problem Relation Age of Onset   Diabetes Father    Parkinsonism Father    Heart failure Father    Prostate cancer Father        dx 50s   Colon polyps Father    Turner syndrome Sister    Cancer Mother 4       pancreatic  capsulated, found when took gallbaldder out   Colon polyps Mother    Breast cancer Neg Hx      Current Outpatient Medications:    amantadine  (SYMMETREL ) 100 MG capsule, Take 1 capsule (100 mg total) by mouth 2 (two) times daily., Disp: 120 capsule, Rfl: 0   busPIRone (BUSPAR) 5 MG tablet, Take by mouth., Disp: , Rfl:    cyanocobalamin (VITAMIN B12) 1000 MCG/ML injection, Inject into the muscle., Disp: , Rfl:    cyclobenzaprine  (FLEXERIL ) 10 MG tablet, Take 10 mg by mouth 3 (three) times daily as needed for muscle spasms., Disp: , Rfl:    Dulaglutide 0.75 MG/0.5ML SOPN, Inject into the skin., Disp: , Rfl:    ibuprofen (ADVIL) 200 MG tablet, Take 400 mg by mouth every 6 (six) hours as needed., Disp: , Rfl:    LORazepam  (ATIVAN ) 0.5 MG tablet, 1-2 tabs 30 - 60 min prior to MRI. Do not drive with this medicine., Disp: 4 tablet, Rfl: 0  meloxicam  (MOBIC ) 15 MG tablet, Take 1 tablet (15 mg total) by mouth daily., Disp: 30 tablet, Rfl: 0   phentermine (ADIPEX-P) 37.5 MG tablet, Take 37.5 mg by mouth., Disp: , Rfl:    SOOLANTRA 1 % CREA, Apply topically., Disp: , Rfl:    tamoxifen  (NOLVADEX ) 20 MG tablet, TAKE 1 TABLET BY MOUTH EVERY DAY, Disp: 90 tablet, Rfl: 2   traZODone (DESYREL) 50 MG tablet, TAKE 1 TABLET BY MOUTH NIGHTLY AS NEEDED FOR SLEEP, Disp: , Rfl:    Turmeric 500 MG TABS, 3-500mg  Orally once a day, Disp: , Rfl:    venlafaxine XR (EFFEXOR-XR) 37.5 MG 24 hr capsule, Take by mouth., Disp: , Rfl:   Physical exam:  Vitals:   07/01/24 1321  BP: 109/71  Pulse: 75  Resp: 16  Temp: (!) 96.3 F (35.7 C)  TempSrc: Tympanic  SpO2: 100%  Weight: 146 lb (66.2 kg)   Physical Exam Cardiovascular:     Rate and Rhythm: Normal rate and regular rhythm.     Heart sounds: Normal heart sounds.  Pulmonary:     Effort: Pulmonary effort is normal.     Breath sounds: Normal breath sounds.  Skin:    General: Skin is warm and dry.  Neurological:     Mental Status: She is alert and oriented to  person, place, and time.      I have personally reviewed labs listed below:    Latest Ref Rng & Units 05/30/2022    8:01 AM  CMP  Glucose 70 - 99 mg/dL 880   BUN 6 - 20 mg/dL 14   Creatinine 9.55 - 1.00 mg/dL 9.06   Sodium 864 - 854 mmol/L 140   Potassium 3.5 - 5.1 mmol/L 4.0   Chloride 98 - 111 mmol/L 113   CO2 22 - 32 mmol/L 20   Calcium 8.9 - 10.3 mg/dL 8.6       Latest Ref Rng & Units 05/30/2022    8:01 AM  CBC  WBC 4.0 - 10.5 K/uL 5.6   Hemoglobin 12.0 - 15.0 g/dL 88.0   Hematocrit 63.9 - 46.0 % 36.8   Platelets 150 - 400 K/uL 202     Assessment and plan- Patient is a 53 y.o. female here for routine follow-up of left breast LCIS presently on tamoxifen   Clinically patient is doing well with no concerning signs and symptoms of recurrence based on today's exam.  Mammogram from April 2025 was unremarkable.  She completes 5 years of tamoxifen  in May 2026.  I will see her back in 6 months no labs.  I will schedule her mammogram for next April as well.  Following that patient can continue to follow-up with GYN or PCP and get yearly breast exams and mammograms through them   Visit Diagnosis 1. Breast neoplasm, Tis (LCIS), left   2. Encounter for monitoring tamoxifen  therapy      Dr. Annah Skene, MD, MPH [facility] [phone] 07/01/2024 12:58 PM

## 2024-07-01 NOTE — Progress Notes (Signed)
 Pt in for follow up, denies any concerns today. Pt is currently micro dosing zepbound and taking phentermine.

## 2024-07-01 NOTE — Addendum Note (Signed)
 Addended by: JASMINE DELON POUR on: 07/01/2024 01:41 PM   Modules accepted: Orders

## 2024-12-30 ENCOUNTER — Inpatient Hospital Stay: Admitting: Oncology
